# Patient Record
Sex: Female | Born: 1960
Health system: Southern US, Community
[De-identification: ages and names within clinical notes are randomized; demographics above are authoritative.]

## PROBLEM LIST (undated history)

## (undated) DIAGNOSIS — M1990 Unspecified osteoarthritis, unspecified site: Secondary | ICD-10-CM

## (undated) DIAGNOSIS — D649 Anemia, unspecified: Secondary | ICD-10-CM

## (undated) DIAGNOSIS — R7309 Other abnormal glucose: Secondary | ICD-10-CM

## (undated) DIAGNOSIS — R768 Other specified abnormal immunological findings in serum: Secondary | ICD-10-CM

## (undated) DIAGNOSIS — R7989 Other specified abnormal findings of blood chemistry: Secondary | ICD-10-CM

## (undated) DIAGNOSIS — E785 Hyperlipidemia, unspecified: Secondary | ICD-10-CM

## (undated) DIAGNOSIS — D219 Benign neoplasm of connective and other soft tissue, unspecified: Secondary | ICD-10-CM

## (undated) DIAGNOSIS — A64 Unspecified sexually transmitted disease: Secondary | ICD-10-CM

## (undated) DIAGNOSIS — I10 Essential (primary) hypertension: Secondary | ICD-10-CM

## (undated) DIAGNOSIS — Z8249 Family history of ischemic heart disease and other diseases of the circulatory system: Secondary | ICD-10-CM

## (undated) DIAGNOSIS — F419 Anxiety disorder, unspecified: Secondary | ICD-10-CM

## (undated) HISTORY — PX: OVARIAN CYST SURGERY: SHX726

## (undated) HISTORY — DX: Unspecified osteoarthritis, unspecified site: M19.90

## (undated) HISTORY — DX: Hyperlipidemia, unspecified: E78.5

## (undated) HISTORY — DX: Benign neoplasm of connective and other soft tissue, unspecified: D21.9

## (undated) HISTORY — DX: Anemia, unspecified: D64.9

## (undated) HISTORY — DX: Other specified abnormal immunological findings in serum: R76.8

## (undated) HISTORY — DX: Other abnormal glucose: R73.09

## (undated) HISTORY — DX: Unspecified sexually transmitted disease: A64

## (undated) HISTORY — DX: Other specified abnormal findings of blood chemistry: R79.89

## (undated) HISTORY — DX: Family history of ischemic heart disease and other diseases of the circulatory system: Z82.49

## (undated) HISTORY — DX: Anxiety disorder, unspecified: F41.9

## (undated) HISTORY — PX: OTHER SURGICAL HISTORY: SHX169

---

## 2003-12-26 HISTORY — PX: PELVIC LAPAROSCOPY: SHX162

## 2010-09-02 ENCOUNTER — Emergency Department (HOSPITAL_COMMUNITY)
Admission: EM | Admit: 2010-09-02 | Discharge: 2010-09-02 | Payer: Self-pay | Source: Home / Self Care | Admitting: Emergency Medicine

## 2010-12-14 ENCOUNTER — Emergency Department (HOSPITAL_COMMUNITY)
Admission: EM | Admit: 2010-12-14 | Discharge: 2010-12-14 | Disposition: A | Discharge status: Home / Self Care | Payer: 59 | Attending: Emergency Medicine | Admitting: Emergency Medicine

## 2010-12-14 ENCOUNTER — Emergency Department (HOSPITAL_COMMUNITY): Payer: 59

## 2010-12-14 DIAGNOSIS — R079 Chest pain, unspecified: Secondary | ICD-10-CM | POA: Insufficient documentation

## 2010-12-14 DIAGNOSIS — I10 Essential (primary) hypertension: Secondary | ICD-10-CM | POA: Insufficient documentation

## 2010-12-14 DIAGNOSIS — E7800 Pure hypercholesterolemia, unspecified: Secondary | ICD-10-CM | POA: Insufficient documentation

## 2010-12-14 LAB — POCT I-STAT, CHEM 8
Creatinine, Ser: 0.8 mg/dL (ref 0.4–1.2)
HCT: 45 % (ref 36.0–46.0)
Hemoglobin: 15.3 g/dL — ABNORMAL HIGH (ref 12.0–15.0)
Potassium: 4.2 mEq/L (ref 3.5–5.1)
Sodium: 140 mEq/L (ref 135–145)

## 2010-12-14 LAB — POCT CARDIAC MARKERS
CKMB, poc: <1 ng/mL — ABNORMAL LOW (ref 1.0–8.0)
Myoglobin, poc: 42.7 ng/mL (ref 12–200)

## 2012-03-31 ENCOUNTER — Emergency Department (HOSPITAL_COMMUNITY)
Admission: EM | Admit: 2012-03-31 | Discharge: 2012-03-31 | Disposition: A | Discharge status: Home / Self Care | Payer: No Typology Code available for payment source | Attending: Emergency Medicine | Admitting: Emergency Medicine

## 2012-03-31 ENCOUNTER — Encounter (HOSPITAL_COMMUNITY): Payer: Self-pay | Admitting: Emergency Medicine

## 2012-03-31 DIAGNOSIS — I10 Essential (primary) hypertension: Secondary | ICD-10-CM | POA: Insufficient documentation

## 2012-03-31 DIAGNOSIS — S161XXA Strain of muscle, fascia and tendon at neck level, initial encounter: Secondary | ICD-10-CM

## 2012-03-31 DIAGNOSIS — S139XXA Sprain of joints and ligaments of unspecified parts of neck, initial encounter: Secondary | ICD-10-CM | POA: Insufficient documentation

## 2012-03-31 DIAGNOSIS — Y92410 Unspecified street and highway as the place of occurrence of the external cause: Secondary | ICD-10-CM | POA: Insufficient documentation

## 2012-03-31 HISTORY — DX: Essential (primary) hypertension: I10

## 2012-03-31 MED ORDER — HYDROCODONE-ACETAMINOPHEN 5-500 MG PO TABS: 1.0000 | ORAL_TABLET | Freq: Four times a day (QID) | ORAL | Status: AC | PRN

## 2012-03-31 MED ORDER — CYCLOBENZAPRINE HCL 10 MG PO TABS: 10.0000 mg | ORAL_TABLET | Freq: Two times a day (BID) | ORAL | Status: AC | PRN

## 2012-03-31 MED ORDER — IBUPROFEN 600 MG PO TABS: 600.0000 mg | ORAL_TABLET | Freq: Four times a day (QID) | ORAL | Status: AC | PRN
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
## 2013-03-20 ENCOUNTER — Other Ambulatory Visit: Payer: Self-pay

## 2013-03-20 DIAGNOSIS — Z1231 Encounter for screening mammogram for malignant neoplasm of breast: Secondary | ICD-10-CM

## 2013-03-23 ENCOUNTER — Telehealth: Payer: Self-pay | Admitting: Cardiovascular Disease

## 2013-03-23 DIAGNOSIS — Z79899 Other long term (current) drug therapy: Secondary | ICD-10-CM

## 2013-03-23 DIAGNOSIS — E782 Mixed hyperlipidemia: Secondary | ICD-10-CM

## 2013-03-23 NOTE — Telephone Encounter (Signed)
Patient has lost her lab slip---she was given this several month ago.

## 2013-03-23 NOTE — Telephone Encounter (Signed)
Returned call and no answer.  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  Labs ordered and lab slip mailed.

## 2013-03-24 ENCOUNTER — Telehealth: Payer: Self-pay | Admitting: Cardiovascular Disease

## 2013-03-24 NOTE — Telephone Encounter (Signed)
Need lab order-Please fax [phone]

## 2013-03-24 NOTE — Telephone Encounter (Signed)
Returned call.  Pt informed her call was returned yesterday and unable to reach her.  Informed lab slip was mailed yesterday and she needs to present to lab fasting to have labs drawn.  Pt verbalized understanding and agreed w/ plan.  Pt with questions about her bill and transferred to [facility] in billing.

## 2013-03-25 ENCOUNTER — Telehealth: Payer: Self-pay | Admitting: Cardiovascular Disease

## 2013-03-25 MED ORDER — ROSUVASTATIN CALCIUM 10 MG PO TABS: 10.0000 mg | ORAL_TABLET | Freq: Every day | ORAL | Status: DC

## 2013-03-25 NOTE — Telephone Encounter (Signed)
Returned call.  Pt informed she should get labs done so that Dr. Allyson Sabal will know if Crestor has been working for her or not before any changes made.  Pt stated she did receive lab sheet and will go tomorrow to have labs done.  Pt stated her insurance changed and it is too expensive now.  Pt stated she has two pills left and informed she can get a few samples until her lab results are reviewed.  Pt verbalized understanding and agreed w/ plan.

## 2013-03-25 NOTE — Telephone Encounter (Signed)
Samples left at front desk for pt pick up.  Lot: [id number] Exp: 08/2015.

## 2013-03-25 NOTE — Telephone Encounter (Signed)
Wants to know if there is something other than Crestor she can take.  Her insurance has changed and it's very expensive.

## 2013-04-03 ENCOUNTER — Ambulatory Visit: Payer: 59

## 2013-04-10 ENCOUNTER — Encounter: Payer: Self-pay | Admitting: Obstetrics and Gynecology

## 2013-04-10 ENCOUNTER — Telehealth: Payer: Self-pay | Admitting: Obstetrics and Gynecology

## 2013-04-17 ENCOUNTER — Ambulatory Visit: Payer: 59

## 2013-05-06 ENCOUNTER — Encounter: Payer: Self-pay | Admitting: Obstetrics and Gynecology

## 2013-05-25 ENCOUNTER — Ambulatory Visit (INDEPENDENT_AMBULATORY_CARE_PROVIDER_SITE_OTHER): Payer: 59 | Admitting: Obstetrics and Gynecology

## 2013-05-25 ENCOUNTER — Encounter: Payer: Self-pay | Admitting: Obstetrics and Gynecology

## 2013-05-25 VITALS — BP 120/80 | HR 64 | Ht 62.5 in | Wt 186.5 lb

## 2013-05-25 DIAGNOSIS — N912 Amenorrhea, unspecified: Secondary | ICD-10-CM

## 2013-05-25 DIAGNOSIS — Z0000 Encounter for general adult medical examination without abnormal findings: Secondary | ICD-10-CM

## 2013-05-25 DIAGNOSIS — Z01419 Encounter for gynecological examination (general) (routine) without abnormal findings: Secondary | ICD-10-CM

## 2013-05-25 LAB — POCT URINALYSIS DIPSTICK
Glucose, UA: NEGATIVE
Ketones, UA: NEGATIVE

## 2013-05-25 LAB — POCT URINE PREGNANCY: Preg Test, Ur: NEGATIVE

## 2013-05-25 NOTE — Patient Instructions (Signed)
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
## 2013-05-27 LAB — IPS PAP TEST WITH HPV

## 2013-05-29 ENCOUNTER — Ambulatory Visit: Payer: 59

## 2013-06-01 ENCOUNTER — Ambulatory Visit: Payer: 59

## 2013-06-02 ENCOUNTER — Telehealth: Payer: Self-pay | Admitting: Obstetrics and Gynecology

## 2013-06-02 NOTE — Telephone Encounter (Signed)
Pt calling for results

## 2013-06-02 NOTE — Telephone Encounter (Signed)
Notes Recorded by Melony Overly, MD on 05/28/2013 at 8:34 AM Pap and HPV negative.  Please place in pap recall - 02.  Patient notified and verbalized understanding. Will follow up in one year.

## 2013-07-02 ENCOUNTER — Other Ambulatory Visit: Payer: Self-pay

## 2013-09-08 ENCOUNTER — Other Ambulatory Visit: Payer: Self-pay | Admitting: *Deleted

## 2013-09-08 ENCOUNTER — Telehealth: Payer: Self-pay | Admitting: Cardiovascular Disease

## 2013-09-08 DIAGNOSIS — Z79899 Other long term (current) drug therapy: Secondary | ICD-10-CM

## 2013-09-08 DIAGNOSIS — E782 Mixed hyperlipidemia: Secondary | ICD-10-CM

## 2013-09-08 NOTE — Telephone Encounter (Signed)
Is asking for a lab order to be mailed to her. It was sent to her before but she stated she lost it . Please mail another .Marland Kitchen Thanks

## 2013-09-08 NOTE — Telephone Encounter (Signed)
Returned call.  Left message that lab order already sent to lab and slip not needed.  Also to call back before 4pm if questions  Of note, pt lost lab slip she had prior to phone call in July 2014 and slip was mailed then.

## 2013-09-30 ENCOUNTER — Ambulatory Visit (INDEPENDENT_AMBULATORY_CARE_PROVIDER_SITE_OTHER): Payer: 59 | Admitting: Family Medicine

## 2013-09-30 VITALS — BP 110/80 | HR 69 | Temp 98.2°F | Resp 18 | Ht 62.5 in | Wt 187.4 lb

## 2013-09-30 DIAGNOSIS — M1710 Unilateral primary osteoarthritis, unspecified knee: Secondary | ICD-10-CM

## 2013-09-30 DIAGNOSIS — IMO0002 Reserved for concepts with insufficient information to code with codable children: Secondary | ICD-10-CM

## 2013-09-30 DIAGNOSIS — R059 Cough, unspecified: Secondary | ICD-10-CM

## 2013-09-30 DIAGNOSIS — M1711 Unilateral primary osteoarthritis, right knee: Secondary | ICD-10-CM

## 2013-09-30 DIAGNOSIS — R05 Cough: Secondary | ICD-10-CM

## 2013-09-30 MED ORDER — NAPROXEN 500 MG PO TABS: 500.0000 mg | ORAL_TABLET | Freq: Two times a day (BID) | ORAL | Status: DC

## 2013-09-30 MED ORDER — BENZONATATE 100 MG PO CAPS: 100.0000 mg | ORAL_CAPSULE | Freq: Three times a day (TID) | ORAL | Status: DC | PRN

## 2013-09-30 NOTE — Progress Notes (Signed)
   Subjective:    Patient ID: Allison Colon, female    DOB: 1961-03-27, 53 y.o.   MRN: [id number]  HPI Patient presents with bad cough that has been present for one month. Initially started with "the flu" and was sick for 6 days. Continued cough for 2-3 weeks after illness. Cough is slowly improving but still very bothersome. No h/o asthma, COPD, lung disease. Has not tried anything. No history of acid reflux. No shortness of breath, DOE, h/o heart problems. Cough is dry cough. No recent moves, no pets. Does not feel sick other than the cough. No fever, night sweat, weight loss.  Patient also complains of joint pains. Has pain in her right knee, left shoulder, and stiffness in the morning in her hands. Stiffness only lasts for a few minutes. Not currently having joint pain. Not tried anything. Saw ortho doc in [place] who did xrays and told her she had the early stages of arthritis. Tried 800 mg ibuprofen which was helpful but the pain has occasionally returned. Pain is intermittent and occasionally bothers her at sleep. Knee occasionally swells.   Review of Systems  All other systems reviewed and are negative.       Objective:   Physical Exam  Constitutional: She is oriented to person, place, and time. She appears well-developed and well-nourished. No distress.  HENT:  Head: Normocephalic and atraumatic.  Right Ear: External ear normal.  Left Ear: External ear normal.  Mouth/Throat: Oropharynx is clear and moist. No oropharyngeal exudate.  Eyes: Conjunctivae are normal. Pupils are equal, round, and reactive to light. No scleral icterus.  Neck: Normal range of motion. Neck supple.  Cardiovascular: Normal rate, regular rhythm and normal heart sounds.   Pulmonary/Chest: Effort normal and breath sounds normal. No respiratory distress.  Musculoskeletal: Normal range of motion.  Lymphadenopathy:    She has no cervical adenopathy.  Neurological: She is alert and oriented to person, place,  and time.  Skin: Skin is warm and dry. She is not diaphoretic.  Psychiatric: She has a normal mood and affect. Her behavior is normal.      Assessment & Plan:  #1. Cough - suspect post viral - no red flag sx for infection, cancer, heart disease, lung disease - trial tessalon perles - followup 2-3 weeks if not improved, consider CXR at that time and possibly spirometry  #2. Joint pain - Early OA - No active pain - exam reassuring against structural problem - Naproxen prn - F/u prn

## 2013-09-30 NOTE — Patient Instructions (Signed)
Thank you for coming in today  For your knee pain and other joint pain - Likely early arthritis like ortho doctor said - Try naproxen up to 2 x per day as needed for pain - Followup as need - Fish oil may help - Also consider trying glucosamine for joint health  For your cough - I think this will resolve with time - Try cough medicine - Followup if not improved in 2-3 weeks  Cough, Adult  A cough is a reflex that helps clear your throat and airways. It can help heal the body or may be a reaction to an irritated airway. A cough may only last 2 or 3 weeks (acute) or may last more than 8 weeks (chronic).  CAUSES Acute cough:  Viral or bacterial infections. Chronic cough:  Infections.  Allergies.  Asthma.  Post-nasal drip.  Smoking.  Heartburn or acid reflux.  Some medicines.  Chronic lung problems (COPD).  Cancer. SYMPTOMS   Cough.  Fever.  Chest pain.  Increased breathing rate.  High-pitched whistling sound when breathing (wheezing).  Colored mucus that you cough up (sputum). TREATMENT   A bacterial cough may be treated with antibiotic medicine.  A viral cough must run its course and will not respond to antibiotics.  Your caregiver may recommend other treatments if you have a chronic cough. HOME CARE INSTRUCTIONS   Only take over-the-counter or prescription medicines for pain, discomfort, or fever as directed by your caregiver. Use cough suppressants only as directed by your caregiver.  Use a cold steam vaporizer or humidifier in your bedroom or home to help loosen secretions.  Sleep in a semi-upright position if your cough is worse at night.  Rest as needed.  Stop smoking if you smoke. SEEK IMMEDIATE MEDICAL CARE IF:   You have pus in your sputum.  Your cough starts to worsen.  You cannot control your cough with suppressants and are losing sleep.  You begin coughing up blood.  You have difficulty breathing.  You develop pain which is  getting worse or is uncontrolled with medicine.  You have a fever. MAKE SURE YOU:   Understand these instructions.  Will watch your condition.  Will get help right away if you are not doing well or get worse. Document Released: 02/09/2011 Document Revised: 11/05/2011 Document Reviewed: 02/09/2011 [facility] Patient Information 2014 [place].

## 2013-10-02 ENCOUNTER — Ambulatory Visit: Payer: 59

## 2013-10-02 ENCOUNTER — Ambulatory Visit
Admission: RE | Admit: 2013-10-02 | Discharge: 2013-10-02 | Disposition: A | Discharge status: Home / Self Care | Payer: 59 | Source: Ambulatory Visit

## 2013-10-02 DIAGNOSIS — Z1231 Encounter for screening mammogram for malignant neoplasm of breast: Secondary | ICD-10-CM

## 2013-10-27 ENCOUNTER — Telehealth: Payer: Self-pay | Admitting: *Deleted

## 2013-10-27 NOTE — Telephone Encounter (Signed)
Returned call and pt verified x 2.  Pt wanted to know if she could come in to see Dr. Gwenlyn Found.  Denied SOB, sweating, nausea, abdominal or radiation of pain.  Denied taking pain med.  Stated when she presses in the area, she can feel a soreness.  Stated she was wondering if it might be muscle related.  Denied injury.   Informed symptoms do not sound heart-related.  Advised she take Rx Naproxen as directed.  Pt stated she doesn't have it w/ her at work.  Pt stated she has Tylenol and informed she can take that while at work and take naproxen when she gets home.  Pt declined offer to see an Extender and will keep her appt w/ Dr. Gwenlyn Found on next week, 3.11.15.  ER instructions given and pt verbalized understanding.

## 2013-10-27 NOTE — Telephone Encounter (Signed)
Pt was calling in regards to having some CP. She pressed on her left side of her breast under her arm and she is sore. Possible muscular. Not a constant pain  JB

## 2013-11-04 ENCOUNTER — Ambulatory Visit: Payer: 59 | Admitting: Cardiovascular Disease

## 2013-11-10 ENCOUNTER — Telehealth: Payer: Self-pay | Admitting: Obstetrics and Gynecology

## 2013-11-10 NOTE — Telephone Encounter (Signed)
Patient is calling wanting to figure out what is going on she thinks she is going through the change (headaches, hot flashes, missed periods, etc) wants to talk to nurse about it.

## 2013-11-10 NOTE — Telephone Encounter (Signed)
Thank you for discussion complimentary medicine options for the menopausal symptoms.   I am happy to see the patient if she needs a consultation to discuss options further. I will close the encounter.

## 2013-11-10 NOTE — Telephone Encounter (Signed)
Spoke with pt who is having random missed periods, headaches, and hot flashes. Pt had regular periods in October and November, missed one in December, and had periods in Jan and Feb. So far pt has missed cycle this month. Pt having headaches occasionally like she gets before her period starts, relieved by Aleve. Pt had a horrible night sweat back at [facility] that drenched her while she was sleeping. Pt noticing sporadic hot flashes and uses fan at work prn. Pt wants advice about what she can try OTC to alleviate symptoms. Advised that soy containing products like soymilk and edamame might help.  Pt just started drinking soy milk this week. Also mentioned black cohosh as supplement pt could try.  Pt wants to try OTC products before she makes appt to discuss with BS. Pt will call back as needed.

## 2013-11-20 ENCOUNTER — Ambulatory Visit: Payer: 59 | Admitting: Cardiovascular Disease

## 2013-12-10 ENCOUNTER — Encounter: Payer: Self-pay | Admitting: *Deleted

## 2013-12-15 ENCOUNTER — Telehealth: Payer: Self-pay | Admitting: Cardiovascular Disease

## 2013-12-16 NOTE — Telephone Encounter (Signed)
Closed encounter °

## 2013-12-17 ENCOUNTER — Ambulatory Visit: Payer: 59 | Admitting: Cardiovascular Disease

## 2013-12-24 ENCOUNTER — Other Ambulatory Visit: Payer: Self-pay | Admitting: Cardiovascular Disease

## 2013-12-28 ENCOUNTER — Encounter: Payer: Self-pay | Admitting: Cardiovascular Disease

## 2013-12-29 ENCOUNTER — Other Ambulatory Visit: Payer: Self-pay | Admitting: *Deleted

## 2013-12-29 MED ORDER — ROSUVASTATIN CALCIUM 10 MG PO TABS: 10.0000 mg | ORAL_TABLET | Freq: Every day | ORAL | Status: DC

## 2013-12-29 NOTE — Telephone Encounter (Signed)
Rx was sent to pharmacy electronically. 

## 2014-01-08 ENCOUNTER — Ambulatory Visit: Payer: 59 | Admitting: Cardiovascular Disease

## 2014-03-22 ENCOUNTER — Telehealth: Payer: Self-pay | Admitting: Obstetrics and Gynecology

## 2014-03-22 NOTE — Telephone Encounter (Signed)
Patient was last seen for annual with Dr.Silva on 05/25/13. Patient as upcoming appointment for HSV outbreak on 7/31 with Milford Cage, [place]. Patient would like to know of soothing methods for HSV. Dr.Silva other than keeping the area clean and dry and using an oatmeal sitz bath is there anything further this patient can do? Please advise.

## 2014-03-22 NOTE — Telephone Encounter (Signed)
Left message to call Kaitlyn at [phone]. 

## 2014-03-22 NOTE — Telephone Encounter (Signed)
I do not see any history of HSV documented in the patient's chart.  Is there some information we do not have? If patient has not been seen for this previously, she will need evaluation prior to a prescription.

## 2014-03-22 NOTE — Telephone Encounter (Signed)
Patient calling to request advice about "soothing an HSV outbreak." She has an appointment with P. Raquel Sarna 03/26/14 as she is not available to come in sooner.

## 2014-03-23 ENCOUNTER — Other Ambulatory Visit: Payer: Self-pay | Admitting: Obstetrics and Gynecology

## 2014-03-23 MED ORDER — VALACYCLOVIR HCL 500 MG PO TABS: 500.0000 mg | ORAL_TABLET | Freq: Two times a day (BID) | ORAL | Status: DC

## 2014-03-23 NOTE — Telephone Encounter (Signed)
Spoke with patient. Patient states that she was diagnosed with HSV when she was 23. States that she will have an outbreak every 2-3 years. Patient has been really stressed with moving lately and had an outbreak which began five days ago. Patient states that it is itching and uncomfortable. Advised patient to keep area clean and dry, avoid tight clothing if possible, and use oatmeal bath to soothe. Patient is agreeable. Patient would like to discuss medication treatment for the HSV. Patient originally scheduled for Friday 7/31 but patient states that she can be sooner if it is close to lunch. Appointment scheduled for tomorrow at 10:30am with Regina Eck CNM. Patient agreeable to date and time. Would like to be put on on call list if anyone cancels today. Will place her on as both NP's schedules are full currently.  Routing to Dr.Silva Cc: .Regina Eck CNM   Routing to provider for final review. Patient agreeable to disposition. Will close encounter

## 2014-03-23 NOTE — Telephone Encounter (Signed)
Patient left a message after hours returning nurse's call.

## 2014-03-23 NOTE — Telephone Encounter (Signed)
Spoke with patient. Advised of message as seen below from [place]. Patient agreeable and verbalizes understanding.  Routing to provider for final review. Patient agreeable to disposition. Will close encounter.

## 2014-03-23 NOTE — Telephone Encounter (Signed)
Left message to call Buffey Zabinski at [phone]. 

## 2014-03-23 NOTE — Telephone Encounter (Signed)
Have patient keep appointment for tomorrow. I will send an Rx to her pharmacy for her to start some Valtrex.

## 2014-03-24 ENCOUNTER — Encounter: Payer: Self-pay | Admitting: Certified Nurse Midwife

## 2014-03-24 ENCOUNTER — Ambulatory Visit (INDEPENDENT_AMBULATORY_CARE_PROVIDER_SITE_OTHER): Payer: 59 | Admitting: Certified Nurse Midwife

## 2014-03-24 VITALS — BP 110/70 | HR 72 | Resp 16 | Ht 62.5 in | Wt 189.0 lb

## 2014-03-24 DIAGNOSIS — B009 Herpesviral infection, unspecified: Secondary | ICD-10-CM | POA: Insufficient documentation

## 2014-03-24 DIAGNOSIS — B3731 Acute candidiasis of vulva and vagina: Secondary | ICD-10-CM

## 2014-03-24 DIAGNOSIS — B373 Candidiasis of vulva and vagina: Secondary | ICD-10-CM

## 2014-03-24 DIAGNOSIS — A6004 Herpesviral vulvovaginitis: Secondary | ICD-10-CM

## 2014-03-24 MED ORDER — VALACYCLOVIR HCL 500 MG PO TABS: ORAL_TABLET | ORAL | Status: DC

## 2014-03-24 MED ORDER — NYSTATIN-TRIAMCINOLONE 100000-0.1 UNIT/GM-% EX CREA: 1.0000 "application " | TOPICAL_CREAM | Freq: Two times a day (BID) | CUTANEOUS | Status: DC

## 2014-03-24 NOTE — Patient Instructions (Signed)
Herpes Simplex Herpes simplex is generally classified as Type 1 or Type 2. Type 1 is generally the type that is responsible for cold sores. Type 2 is generally associated with sexually transmitted diseases. We now know that most of the thoughts on these viruses are inaccurate. We find that HSV1 is also present genitally and HSV2 can be present orally, but this will vary in different locations of the world. Herpes simplex is usually detected by doing a culture. Blood tests are also available for this virus; however, the accuracy is often not as good.  PREPARATION FOR TEST No preparation or fasting is necessary. NORMAL FINDINGS  No virus present  No HSV antigens or antibodies present Ranges for normal findings may vary among different laboratories and hospitals. You should always check with your doctor after having lab work or other tests done to discuss the meaning of your test results and whether your values are considered within normal limits. MEANING OF TEST  Your caregiver will go over the test results with you and discuss the importance and meaning of your results, as well as treatment options and the need for additional tests if necessary. OBTAINING THE TEST RESULTS  It is your responsibility to obtain your test results. Ask the lab or department performing the test when and how you will get your results. Document Released: 09/15/2004 Document Revised: 11/05/2011 Document Reviewed: 07/24/2008 [employer] Patient Information 2015 [employer]. This information is not intended to replace advice given to you by your health care provider. Make sure you discuss any questions you have with your health care provider.  

## 2014-03-24 NOTE — Progress Notes (Signed)
53 y.o. married african american female [id number] here with complaint of vulva symptoms of itching, burning, and herpes outbreak for the past 4-5 days.. Patient applied Abrevia to area which has helped, but the itching continues. History of HSV 2 and Valtrex use, but did not have Rx.. . Denies new personal products or vaginal dryness. No STD concerns. Urinary symptoms none . Patient has been very stressed with the purchase of a new house and now has closed on and moved in! Spouse also has HSV 2 but no recent outbreak. Denies increase vaginal discharge or vaginal complaints. Declines vaginal exam.  O:Healthy female WDWN Affect: normal, orientation x 3  Exam: Abdomen: non tender Lymph node: no enlargement or tenderness noted Pelvic exam: External genital: Herpetic blister noted in left groin crease. Vulva increase redness and scaling noted. No other areas noted or lesions. Wet prep taken.  Wet Prep results: positive for yeast   A: HSV 2 outbreak, (known) Yeast vulvitis   P:Discussed findings of Herpes blister and yeast vuvitis and etiology.Discussed Valtrex use for suppression to avoid recurrent outbreaks. Discussed Aveeno or baking soda sitz bath for comfort. Avoid moist clothes or pads for extended period of time. If working out in gym clothes or swim suits for long periods of time change underwear or bottoms of swimsuit if possible.  Rx: Valtrex see order with instructions Rx Mycolog cream see order  Rv prn

## 2014-03-25 NOTE — Progress Notes (Signed)
Reviewed personally.  M. Suzanne Ziyah Cordoba, MD.  

## 2014-03-26 ENCOUNTER — Ambulatory Visit: Payer: 59 | Admitting: Nurse Practitioner

## 2014-04-21 ENCOUNTER — Encounter: Payer: Self-pay | Admitting: Obstetrics and Gynecology

## 2014-05-10 ENCOUNTER — Other Ambulatory Visit: Payer: Self-pay | Admitting: Cardiovascular Disease

## 2014-05-10 NOTE — Telephone Encounter (Signed)
Rx was sent to pharmacy electronically. 

## 2014-05-25 ENCOUNTER — Other Ambulatory Visit: Payer: Self-pay | Admitting: Cardiovascular Disease

## 2014-05-25 ENCOUNTER — Telehealth: Payer: Self-pay | Admitting: Cardiovascular Disease

## 2014-05-25 DIAGNOSIS — E7800 Pure hypercholesterolemia, unspecified: Secondary | ICD-10-CM

## 2014-05-25 MED ORDER — ROSUVASTATIN CALCIUM 10 MG PO TABS: ORAL_TABLET | ORAL | Status: DC

## 2014-05-25 MED ORDER — ENALAPRIL MALEATE 10 MG PO TABS: 10.0000 mg | ORAL_TABLET | Freq: Two times a day (BID) | ORAL | Status: DC

## 2014-05-25 NOTE — Telephone Encounter (Signed)
Pt would like some lab orders put in prior to her upcoming appointment with Dr.Berry. Please call   Thanks

## 2014-05-25 NOTE — Telephone Encounter (Signed)
Spoke with pt, aware orders placed for profile and cmet.

## 2014-05-27 ENCOUNTER — Ambulatory Visit: Payer: 59 | Admitting: Obstetrics and Gynecology

## 2014-06-02 ENCOUNTER — Ambulatory Visit: Payer: 59 | Admitting: Obstetrics and Gynecology

## 2014-06-02 ENCOUNTER — Telehealth: Payer: Self-pay | Admitting: Obstetrics and Gynecology

## 2014-06-02 NOTE — Telephone Encounter (Signed)
Thanks Nora °

## 2014-06-02 NOTE — Telephone Encounter (Signed)
Patient canceled her appointment today "cycle started". Patient rescheduled.

## 2014-06-07 NOTE — Telephone Encounter (Signed)
Spoke with patient at time of incoming call. Patient states that she has not had a cycle for 2 months and started her cycle last Thursday. States that her cycle usually lasts 5 days and the last two days are usually light. This will be day five and she is having to change pad/tampon every 4-5 hours. Patient denies any abdominal pain. States she had mild cramping last week. Advised patient that as long as bleeding does not increase this is normal. Advised will need to call to be seen sooner if she has any pain, feels light headed, dizzy, or bleeding increases. Patient is agreeable. Patient has appointment for aex on 10/14 with Dr.Silva. Advised will send a message to Dr.Silva as well and if any further recommendations will return call to let her know. Advised will need to let Dr.Silva know of bleeding pattern when she is in for aex. Patient is agreeable.  Dr.Silva anything further needed for patient?

## 2014-06-07 NOTE — Telephone Encounter (Signed)
Pt has some bleeding concerns and wants to talk to the nurse. Says she has missed some months but it os super heavy now.

## 2014-06-07 NOTE — Telephone Encounter (Signed)
I will see patient on 10/14 for her exam.  Nothing further to do at this time.

## 2014-06-09 ENCOUNTER — Ambulatory Visit: Payer: 59 | Admitting: Obstetrics and Gynecology

## 2014-06-09 ENCOUNTER — Telehealth: Payer: Self-pay | Admitting: Obstetrics and Gynecology

## 2014-06-09 NOTE — Telephone Encounter (Signed)
Thank you for the update!

## 2014-06-09 NOTE — Telephone Encounter (Signed)
Patient calling to cancel her AEX today with Dr. Quincy Simmonds due to "not comfortable coming to be seen on menstrual cycle." No fee charge for missed appointment. Patient rescheduled to 09/22/14 with Dr. Quincy Simmonds.

## 2014-06-28 ENCOUNTER — Encounter: Payer: Self-pay | Admitting: Certified Nurse Midwife

## 2014-07-07 ENCOUNTER — Ambulatory Visit: Payer: 59 | Admitting: Cardiovascular Disease

## 2014-07-19 ENCOUNTER — Telehealth: Payer: Self-pay | Admitting: Obstetrics and Gynecology

## 2014-07-19 NOTE — Telephone Encounter (Signed)
Left message regarding cancelled aex appointment.

## 2014-08-06 ENCOUNTER — Ambulatory Visit: Payer: 59 | Admitting: Obstetrics and Gynecology

## 2014-08-31 ENCOUNTER — Other Ambulatory Visit: Payer: Self-pay | Admitting: Obstetrics and Gynecology

## 2014-08-31 ENCOUNTER — Telehealth: Payer: Self-pay | Admitting: Obstetrics and Gynecology

## 2014-08-31 ENCOUNTER — Other Ambulatory Visit: Payer: Self-pay

## 2014-08-31 DIAGNOSIS — Z1211 Encounter for screening for malignant neoplasm of colon: Secondary | ICD-10-CM

## 2014-08-31 DIAGNOSIS — Z1231 Encounter for screening mammogram for malignant neoplasm of breast: Secondary | ICD-10-CM

## 2014-08-31 NOTE — Telephone Encounter (Signed)
Routing to [employer] as FYI regarding referral.  Will close encounter.

## 2014-08-31 NOTE — Telephone Encounter (Signed)
I will send in a referral to Dr. Collene Mares.

## 2014-08-31 NOTE — Telephone Encounter (Signed)
Pt states she wants a referral for a colonoscopy based on her age. Pt would like something soon and close by. Pt requests call once appointment made. Pt sees Dr Quincy Simmonds  bf

## 2014-08-31 NOTE — Telephone Encounter (Signed)
Pt called to confirm her appointment with dr Quincy Simmonds for 09/22/14. Told patient that appointment was cancelled because Dr Quincy Simmonds will not be in the office. I told her Dr Elza Rafter next available is 07/27/15 she was not happy and asked to speak with Dr Quincy Simmonds because she says that is unfair to her that no one called her and she was just cancelled. Went back to the message that I left for her on 07/19/14 to call and reschedule her appointment. She said that "she didn't get any message being that it was the holidays and we should have just scheduled something for her". Told her I would have Starla give her a call because that was Dr Elza Rafter next available if she wanted to see Dr Quincy Simmonds only. She said "yes, have her and Dr Quincy Simmonds call me".

## 2014-08-31 NOTE — Telephone Encounter (Signed)
Dr.Silva, okay to place referral to Dr.Mann for consult for routine screening colonoscopy?

## 2014-09-02 NOTE — Telephone Encounter (Signed)
Spoke with patient and explained that we do not reschedule appointments anymore that were cancelled by the provider unless we are communicating with the patient directly. Patient voiced understanding and is aware she is on the wait list to be called in the event of an opening with Dr. Quincy Simmonds. The patient did not seems as upset when I spoke with her. I also explained we had another provider leave recently and that is why the schedules are so full. The patient did not request to speak directly with Dr. Quincy Simmonds during our call. FYI only.

## 2014-09-02 NOTE — Telephone Encounter (Signed)
Please work with Gay Filler to try to find a slot sooner for me to see the patient.   Thank you.

## 2014-09-03 NOTE — Telephone Encounter (Signed)
Left a message on voicemail re: opening for AEX Monday, 09/06/14, with Dr. Quincy Simmonds.

## 2014-09-06 NOTE — Telephone Encounter (Signed)
Spoke with patient and she says is on her menstrual cycle right now and not comfortable being seen due to that. She asked me to call her with another opening so we will keep her on the list.

## 2014-09-08 ENCOUNTER — Ambulatory Visit: Payer: 59 | Admitting: Cardiovascular Disease

## 2014-09-21 NOTE — Telephone Encounter (Signed)
Please offer appointment on 10-18-14, surgery has been canceled on this morning and there are appointments on hold.

## 2014-09-22 ENCOUNTER — Encounter (HOSPITAL_COMMUNITY): Payer: Self-pay | Admitting: Emergency Medicine

## 2014-09-22 ENCOUNTER — Ambulatory Visit: Payer: 59 | Admitting: Obstetrics and Gynecology

## 2014-09-22 ENCOUNTER — Emergency Department (HOSPITAL_COMMUNITY)
Admission: EM | Admit: 2014-09-22 | Discharge: 2014-09-22 | Discharge status: Against Medical Advice | Payer: 59 | Attending: Emergency Medicine | Admitting: Emergency Medicine

## 2014-09-22 DIAGNOSIS — R682 Dry mouth, unspecified: Secondary | ICD-10-CM | POA: Insufficient documentation

## 2014-09-22 DIAGNOSIS — R42 Dizziness and giddiness: Secondary | ICD-10-CM | POA: Insufficient documentation

## 2014-09-22 DIAGNOSIS — I10 Essential (primary) hypertension: Secondary | ICD-10-CM | POA: Insufficient documentation

## 2014-09-22 LAB — BASIC METABOLIC PANEL
Anion gap: 12 (ref 5–15)
BUN: 10 mg/dL (ref 6–23)
CALCIUM: 9.7 mg/dL (ref 8.4–10.5)
CO2: 24 mmol/L (ref 19–32)
Chloride: 106 mmol/L (ref 96–112)
Creatinine, Ser: 0.75 mg/dL (ref 0.50–1.10)
GFR calc Af Amer: >90 mL/min (ref >90)
GLUCOSE: 94 mg/dL (ref 70–99)
Potassium: 4.2 mmol/L (ref 3.5–5.1)
Sodium: 142 mmol/L (ref 135–145)

## 2014-09-22 LAB — CBC
HCT: 40.7 % (ref 36.0–46.0)
Hemoglobin: 13.6 g/dL (ref 12.0–15.0)
MCH: 29.1 pg (ref 26.0–34.0)
MCHC: 33.4 g/dL (ref 30.0–36.0)
MCV: 87.2 fL (ref 78.0–100.0)
Platelets: 253 10*3/uL (ref 150–400)
RBC: 4.67 MIL/uL (ref 3.87–5.11)
RDW: 13.2 % (ref 11.5–15.5)
WBC: 9.5 10*3/uL (ref 4.0–10.5)

## 2014-09-22 NOTE — Telephone Encounter (Signed)
Routing to provider for final review. Patient agreeable to disposition. Will close encounter  Routing to Dr Sabra Heck while Dr Quincy Simmonds is out.

## 2014-09-22 NOTE — ED Notes (Signed)
Pt sts she was only going to wait 10 more mins due to the fact she has kids at home.  Gaffer

## 2014-09-22 NOTE — Telephone Encounter (Signed)
Patient scheduled for 10/18/14 at 11:30. Will keep on wait list in case something opens up earlier per patient request. Okay to close encounter?

## 2014-09-22 NOTE — ED Notes (Signed)
Pt apparently left ED room without waiting for EDP.

## 2014-09-22 NOTE — ED Notes (Addendum)
Patient reports dizziness starting after eating 2 slices of pizza and drinking pink lemonade today. Says, "I just didn't feel like I have control." Has experienced anxiety attack before but says this feels different. Denies chest pain/SOB/nausea/diarrhea/emesis/chills. Takes 10 mg Enalipril per day for HTN. Ambulatory. Neurologically intact. Unsure if she could be pregnant.

## 2014-09-23 ENCOUNTER — Ambulatory Visit: Payer: 59 | Admitting: Obstetrics and Gynecology

## 2014-09-27 IMAGING — MG MM SCREENING BREAST TOMO BILATERAL
8 of 12 series · 8 of 28 positions shown · non-contrast
Comparison: Previous exam(s).

CLINICAL DATA: Screening.

EXAM:
DIGITAL SCREENING BILATERAL MAMMOGRAM WITH 3D TOMO WITH CAD
DIGITAL BREAST TOMOSYNTHESIS
Digital breast tomosynthesis images are acquired in two projections.
These images are reviewed in combination with the digital mammogram,
confirming the findings below.

[L CC]
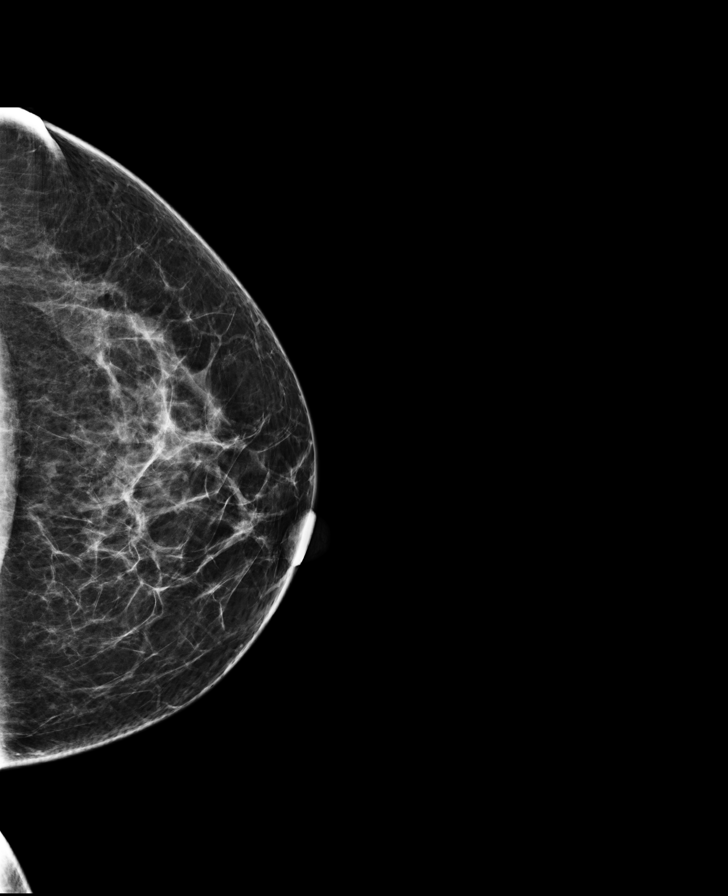

[R CC]
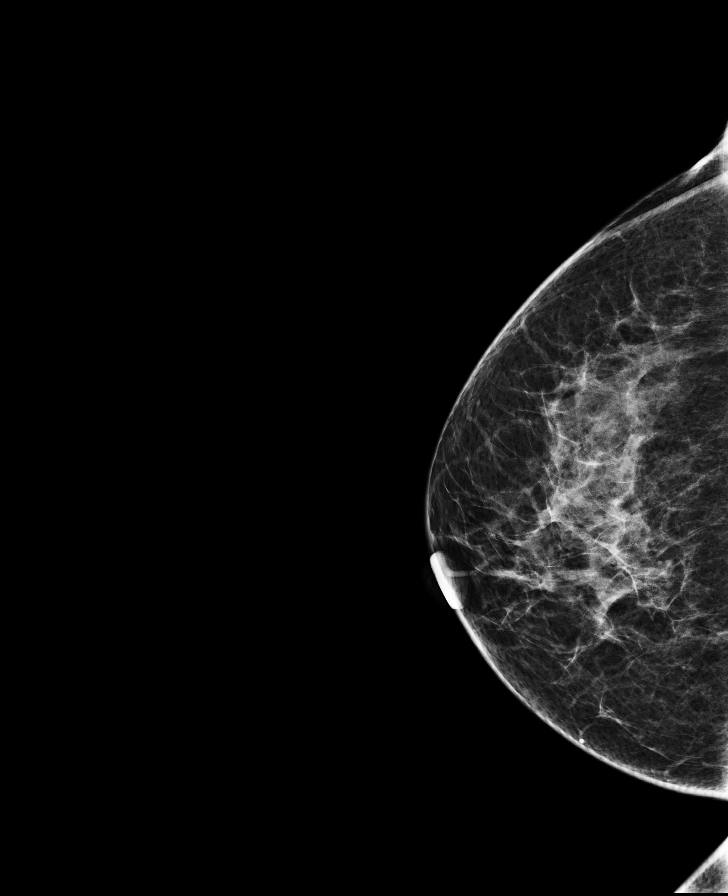

[R MLO]
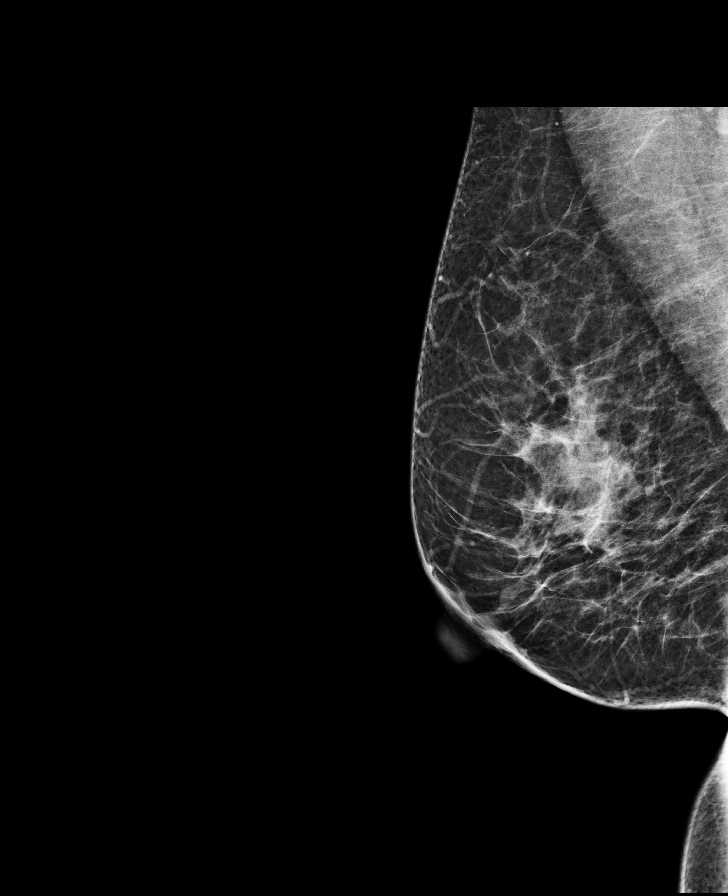

[L MLO]
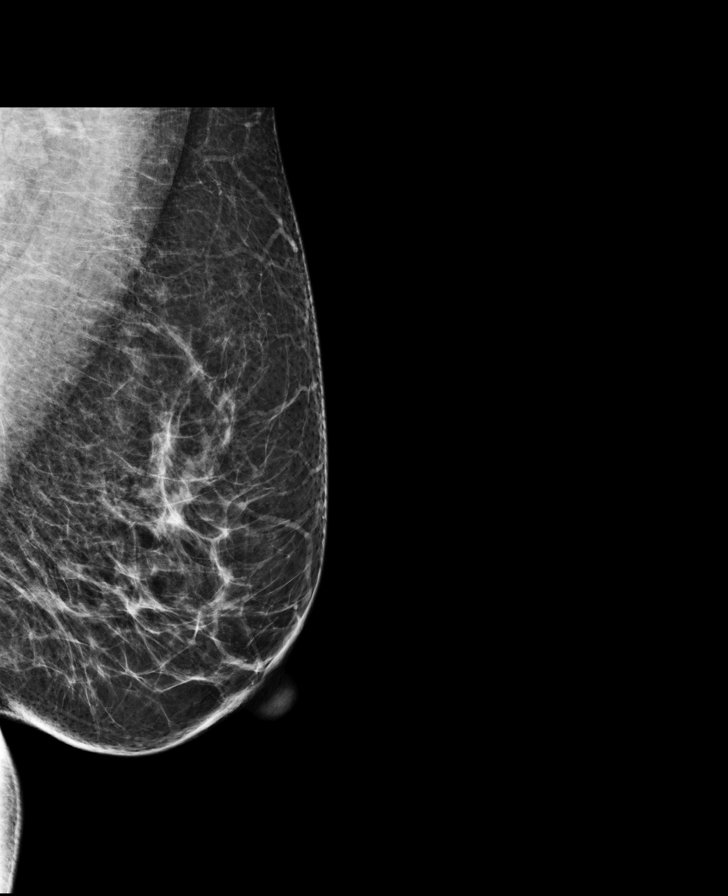

[R CC synth-2D]
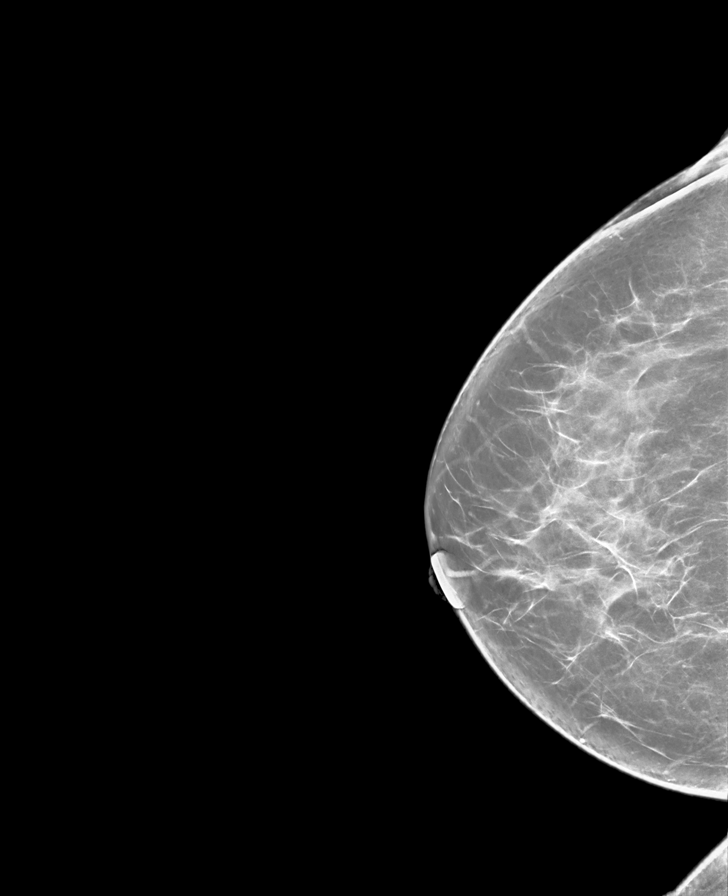

[R MLO synth-2D]
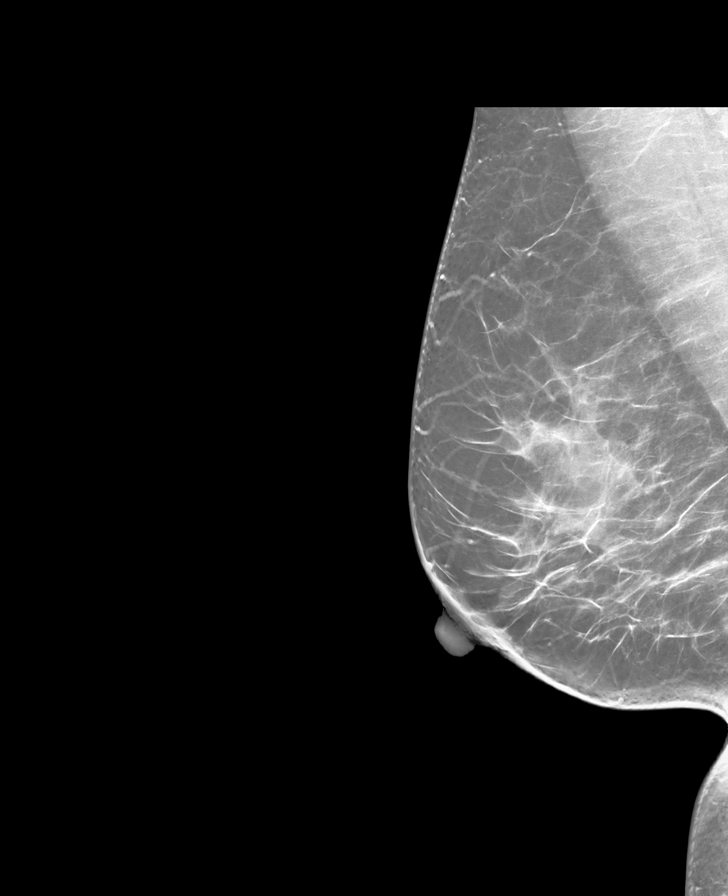

[L MLO synth-2D]
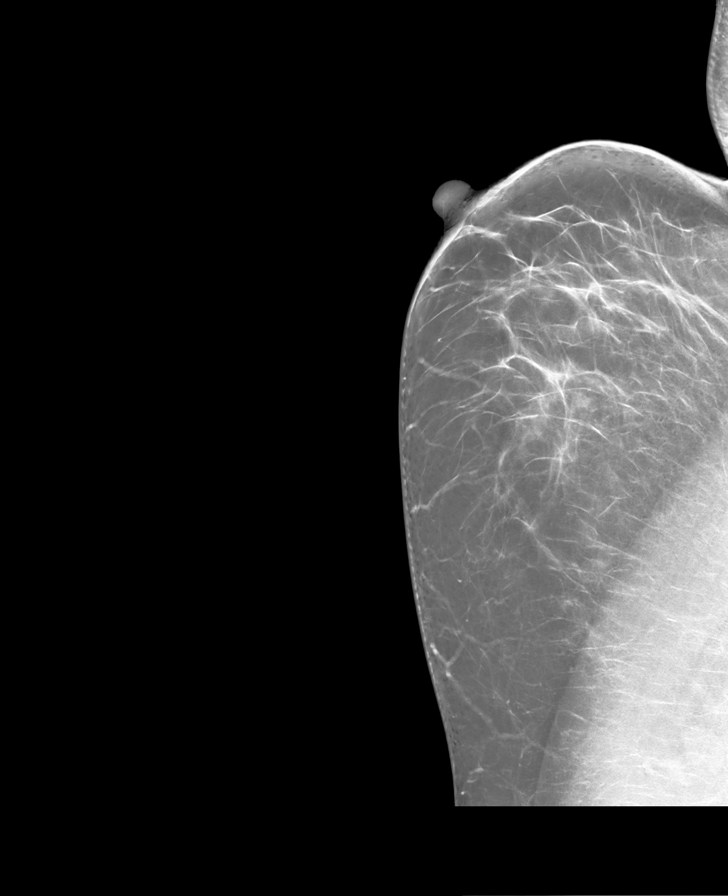

[L CC synth-2D]
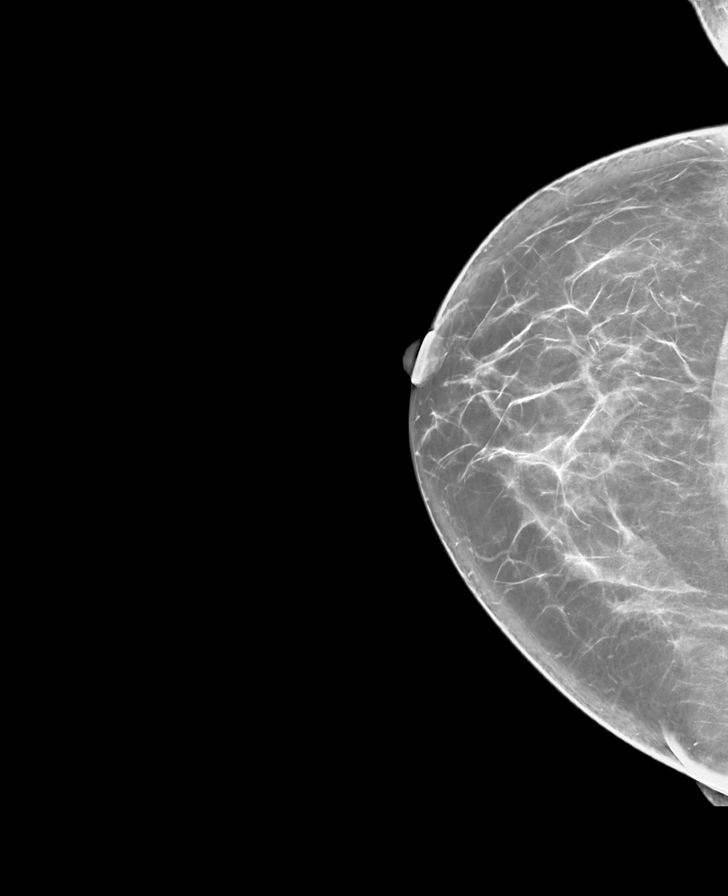

[8 of 28 positions shown; findings below may reference images not displayed]

ACR Breast Density Category b: There are scattered areas of
fibroglandular density.
FINDINGS: There are no findings suspicious for malignancy. Images were
processed with CAD.
IMPRESSION: No mammographic evidence of malignancy. A result letter of this
screening mammogram will be mailed directly to the patient.

RECOMMENDATION:
Screening mammogram in one year. (Code:LC-5-VFV)

BI-RADS CATEGORY  1: Negative.

## 2014-09-30 ENCOUNTER — Ambulatory Visit: Payer: 59 | Admitting: Obstetrics and Gynecology

## 2014-10-04 ENCOUNTER — Ambulatory Visit: Payer: Self-pay

## 2014-10-11 ENCOUNTER — Ambulatory Visit: Payer: 59

## 2014-10-12 ENCOUNTER — Encounter: Payer: Self-pay | Admitting: Cardiovascular Disease

## 2014-10-12 ENCOUNTER — Ambulatory Visit (INDEPENDENT_AMBULATORY_CARE_PROVIDER_SITE_OTHER): Payer: 59 | Admitting: Cardiovascular Disease

## 2014-10-12 VITALS — BP 126/86 | HR 86 | Ht 61.0 in | Wt 191.3 lb

## 2014-10-12 DIAGNOSIS — I10 Essential (primary) hypertension: Secondary | ICD-10-CM

## 2014-10-12 DIAGNOSIS — Z79899 Other long term (current) drug therapy: Secondary | ICD-10-CM

## 2014-10-12 DIAGNOSIS — Z7689 Persons encountering health services in other specified circumstances: Secondary | ICD-10-CM

## 2014-10-12 DIAGNOSIS — Z0289 Encounter for other administrative examinations: Secondary | ICD-10-CM

## 2014-10-12 DIAGNOSIS — E785 Hyperlipidemia, unspecified: Secondary | ICD-10-CM

## 2014-10-12 NOTE — Assessment & Plan Note (Signed)
History of hypertension on enalapril 10 mg a day with blood pressure measured today at 126/86. Continue current meds at current dose

## 2014-10-12 NOTE — Patient Instructions (Addendum)
Your physician wants you to follow-up in 1 year with Dr. Gwenlyn Found. You will receive a reminder letter in the mail 2 months in advance. If you do not receive a letter, please call our office to schedule the follow-up appointment.  Dr. Gwenlyn Found has ordered for you to have lab work done.  You have been referred to [facility]
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
## 2014-10-12 NOTE — Assessment & Plan Note (Signed)
History of hyperlipidemia on atorvastatin 10 mg a day. We will recheck a lipid and liver profile

## 2014-10-13 LAB — HEPATIC FUNCTION PANEL
ALT: 15 U/L (ref 0–35)
AST: 20 U/L (ref 0–37)
Albumin: 4.1 g/dL (ref 3.5–5.2)
Alkaline Phosphatase: 45 U/L (ref 39–117)
Bilirubin, Direct: 0.1 mg/dL (ref 0.0–0.3)
Indirect Bilirubin: 0.4 mg/dL (ref 0.2–1.2)
Total Bilirubin: 0.5 mg/dL (ref 0.2–1.2)
Total Protein: 6.8 g/dL (ref 6.0–8.3)

## 2014-10-13 LAB — LIPID PANEL
CHOL/HDL RATIO: 3.1 ratio
Cholesterol: 154 mg/dL (ref 0–200)
HDL: 49 mg/dL (ref >39)
LDL CALC: 89 mg/dL (ref 0–99)
TRIGLYCERIDES: 81 mg/dL (ref <150)
VLDL: 16 mg/dL (ref 0–40)

## 2014-10-14 ENCOUNTER — Ambulatory Visit: Payer: 59

## 2014-10-14 ENCOUNTER — Other Ambulatory Visit: Payer: Self-pay | Admitting: Orthopaedic Surgery

## 2014-10-14 ENCOUNTER — Other Ambulatory Visit: Payer: Self-pay

## 2014-10-14 ENCOUNTER — Ambulatory Visit
Admission: RE | Admit: 2014-10-14 | Discharge: 2014-10-14 | Disposition: A | Discharge status: Home / Self Care | Payer: 59 | Source: Ambulatory Visit

## 2014-10-14 DIAGNOSIS — M25511 Pain in right shoulder: Secondary | ICD-10-CM

## 2014-10-14 DIAGNOSIS — Z1231 Encounter for screening mammogram for malignant neoplasm of breast: Secondary | ICD-10-CM

## 2014-10-18 ENCOUNTER — Telehealth: Payer: Self-pay | Admitting: Obstetrics and Gynecology

## 2014-10-18 ENCOUNTER — Ambulatory Visit: Payer: Self-pay | Admitting: Obstetrics and Gynecology

## 2014-10-18 NOTE — Telephone Encounter (Signed)
Please find a slot for the patient to come in sooner.  As long as it is a 30 minute slot, that is great!

## 2014-10-18 NOTE — Telephone Encounter (Signed)
Pt scheduled on Monday 2/29 @ 10am. Pt agreeable.

## 2014-10-18 NOTE — Telephone Encounter (Signed)
Thank you for scheduling on February for me to see the patient! Encounter closed.

## 2014-10-18 NOTE — Telephone Encounter (Signed)
Pt called over weekend and left voicemail to cancel her aex 10/18/14. Pt states started cycle and is uncomfortable having aex on her cycle. Dr Elza Rafter next aex in 08/2015. Pt preferred to be added to wait list without scheduling at this time. Pt added to wait list.

## 2014-10-24 ENCOUNTER — Other Ambulatory Visit: Payer: 59

## 2014-10-25 ENCOUNTER — Encounter: Payer: Self-pay | Admitting: Obstetrics and Gynecology

## 2014-10-25 ENCOUNTER — Ambulatory Visit (INDEPENDENT_AMBULATORY_CARE_PROVIDER_SITE_OTHER): Payer: 59 | Admitting: Obstetrics and Gynecology

## 2014-10-25 VITALS — BP 116/80 | HR 90 | Ht 62.5 in | Wt 190.4 lb

## 2014-10-25 DIAGNOSIS — Z01419 Encounter for gynecological examination (general) (routine) without abnormal findings: Secondary | ICD-10-CM

## 2014-10-25 DIAGNOSIS — A6004 Herpesviral vulvovaginitis: Secondary | ICD-10-CM

## 2014-10-25 MED ORDER — VALACYCLOVIR HCL 500 MG PO TABS
ORAL_TABLET | ORAL | Status: DC
Start: 2014-10-25 — End: 2015-11-09

## 2014-10-25 NOTE — Patient Instructions (Signed)
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
## 2014-10-30 ENCOUNTER — Ambulatory Visit
Admission: RE | Admit: 2014-10-30 | Discharge: 2014-10-30 | Disposition: A | Discharge status: Home / Self Care | Payer: 59 | Source: Ambulatory Visit | Attending: Orthopaedic Surgery | Admitting: Orthopaedic Surgery

## 2014-10-30 DIAGNOSIS — M25511 Pain in right shoulder: Secondary | ICD-10-CM

## 2014-11-13 ENCOUNTER — Ambulatory Visit
Admission: RE | Admit: 2014-11-13 | Discharge: 2014-11-13 | Disposition: A | Discharge status: Home / Self Care | Payer: 59 | Source: Ambulatory Visit | Attending: Orthopaedic Surgery | Admitting: Orthopaedic Surgery

## 2015-01-04 ENCOUNTER — Telehealth: Payer: Self-pay | Admitting: Obstetrics and Gynecology

## 2015-01-04 NOTE — Telephone Encounter (Signed)
Pt states she would like to speak with Dr Elza Rafter nurse regarding menopausal symptoms - headaches, emotional, period changes, mood swings, insomnia. " I just feel down, like i want to cry for no reason. I can't take this"

## 2015-01-04 NOTE — Telephone Encounter (Signed)
Spoke with patient. She states that she really feels like she is having menopausal changes. LMP 12/24/14 and lasted for ten days, not heavy or concerning for patient, however continued to have spotting for up to ten days. States "I am sitting at my desk and I just feel so down. The biggest thing is I feel these mood swings and the tiniest little thing gets me so angry and upset." Patient states she is not sleeping well. Also having intermittent headaches. States she usually treats headaches with OTC BC Powder and last night this did not help her headache, and it usually does. She states all of these symptoms have been going on "for awhile now." Patient denies any thoughts of harm to self or anyone else.  Patient with hx of high blood pressure, states she took her BP at home today and it was 121/79. Patient states she has seen PCP regarding headaches and states she is referred back to to GYN because they feel are related to hormones.  Patient declines office visit today with NP.  Patient requests specifically that hormone levels be drawn. Advised would need office visit to discuss with provider to see if blood testing is appropriate. Patient declines. She requests that I send a message to Dr. Quincy Simmonds and return call with her response.Patient is advised that Dr. Quincy Simmonds is out of the office today, in surgery, so I do not know when she will be able to review message. Patient aware and states she feels okay to wait for response from Dr. Quincy Simmonds.

## 2015-01-05 NOTE — Telephone Encounter (Signed)
1404: Outgoing call to patient. She is given message from Dr. Quincy Simmonds and is agreeable. She wrote down names of suggestions from Dr. Quincy Simmonds and states she will try those alternatives. Patient agreeable to appointment with Dr. Quincy Simmonds, however, requests only appointment during "lunch hours." Multiple date/time options discussed and offered, patient chooses to be scheduled for 01/12/15 at 1300 with Dr. Quincy Simmonds. Advised patient to return call at any time to office if any worsening of symptoms or concern, patient verbalizes understanding of instructions.   Routing to provider for final review. Patient agreeable to disposition. Patient aware provider will review message and nurse will return call with any additional instructions or change of disposition. Will close encounter.

## 2015-01-05 NOTE — Telephone Encounter (Signed)
I really would like to help the patient and recommend an office visit for proper evaluation and treatment!  There are a lot of options on how to treat her symptoms.  If she would like to do an herbal therapy such as Estroven for menopausal symptoms and St. John's Wort for depression symptoms, she may start these now.  Thank you.

## 2015-01-06 ENCOUNTER — Other Ambulatory Visit: Payer: Self-pay | Admitting: Orthopedic Surgery

## 2015-01-12 ENCOUNTER — Telehealth: Payer: Self-pay | Admitting: Obstetrics and Gynecology

## 2015-01-12 ENCOUNTER — Ambulatory Visit: Payer: 59 | Admitting: Obstetrics and Gynecology

## 2015-01-12 NOTE — Telephone Encounter (Signed)
Thank you for rescheduling this appointment.   Note in chart from 01/05/15 confirms patient agreed by phone for her appointment for today.  Cc-Greta Jimmye Norman

## 2015-01-12 NOTE — Telephone Encounter (Signed)
Spoke with patient regarding her missed appointment from today and she says she didn't know she had an appointment.  Was still waiting on Dr Quincy Simmonds to call her.

## 2015-01-12 NOTE — Telephone Encounter (Signed)
Call to patient. Patient states she is unable to talk at this time and can only briefly discuss appointment. Advised patient that she was scheduled for office visit today at 1:00 with Dr. Quincy Simmonds when we spoke on the 11 th and she was given instructions on starting OTC treatment prior to appointment, we discussed multiple dates and time options but were able to find an appointment that worked to accommodate her time window. Patient states "this must be a miscommunication on your end because I didn't make an appointment."  Patient would like to reschedule and she is given appointment for this Friday 01/14/15 at 1:00. Confirmed appointment date and time with patient and patient agrees and disconnected line.   Routing to provider for final review.  Will close encounter. Patient rescheduled as disposition.

## 2015-01-14 ENCOUNTER — Ambulatory Visit: Payer: 59 | Admitting: Obstetrics and Gynecology

## 2015-01-14 ENCOUNTER — Telehealth: Payer: Self-pay | Admitting: Obstetrics and Gynecology

## 2015-01-14 NOTE — Telephone Encounter (Signed)
Patient dnka appointment "Menopausal symptoms/depression symptoms" 01/14/15 with Dr.Silva @ 1:00pm. Left message for patient to call and reschedule.

## 2015-01-20 NOTE — Telephone Encounter (Signed)
No further follow up is needed. Patient responsibility to follow up.  Encounter closed.

## 2015-01-28 ENCOUNTER — Ambulatory Visit (INDEPENDENT_AMBULATORY_CARE_PROVIDER_SITE_OTHER): Payer: 59 | Admitting: Obstetrics and Gynecology

## 2015-01-28 ENCOUNTER — Encounter: Payer: Self-pay | Admitting: Obstetrics and Gynecology

## 2015-01-28 VITALS — BP 122/82 | HR 80 | Ht 62.5 in | Wt 189.2 lb

## 2015-01-28 DIAGNOSIS — N951 Menopausal and female climacteric states: Secondary | ICD-10-CM | POA: Diagnosis not present

## 2015-01-28 LAB — TSH: TSH: 1.121 u[IU]/mL (ref 0.350–4.500)

## 2015-01-28 NOTE — Patient Instructions (Signed)
Menopause Menopause is the normal time of life when menstrual periods stop completely. Menopause is complete when you have missed 12 consecutive menstrual periods. It usually occurs between the ages of 48 years and 55 years. Very rarely does a woman develop menopause before the age of 40 years. At menopause, your ovaries stop producing the female hormones estrogen and progesterone. This can cause undesirable symptoms and also affect your health. Sometimes the symptoms may occur 4-5 years before the menopause begins. There is no relationship between menopause and:  Oral contraceptives.  Number of children you had.  Race.  The age your menstrual periods started (menarche). Heavy smokers and very thin women may develop menopause earlier in life. CAUSES  The ovaries stop producing the female hormones estrogen and progesterone.  Other causes include:  Surgery to remove both ovaries.  The ovaries stop functioning for no known reason.  Tumors of the pituitary gland in the brain.  Medical disease that affects the ovaries and hormone production.  Radiation treatment to the abdomen or pelvis.  Chemotherapy that affects the ovaries. SYMPTOMS   Hot flashes.  Night sweats.  Decrease in sex drive.  Vaginal dryness and thinning of the vagina causing painful intercourse.  Dryness of the skin and developing wrinkles.  Headaches.  Tiredness.  Irritability.  Memory problems.  Weight gain.  Bladder infections.  Hair growth of the face and chest.  Infertility. More serious symptoms include:  Loss of bone (osteoporosis) causing breaks (fractures).  Depression.  Hardening and narrowing of the arteries (atherosclerosis) causing heart attacks and strokes. DIAGNOSIS   When the menstrual periods have stopped for 12 straight months.  Physical exam.  Hormone studies of the blood. TREATMENT  There are many treatment choices and nearly as many questions about them. The  decisions to treat or not to treat menopausal changes is an individual choice made with your health care provider. Your health care provider can discuss the treatments with you. Together, you can decide which treatment will work best for you. Your treatment choices may include:   Hormone therapy (estrogen and progesterone).  Non-hormonal medicines.  Treating the individual symptoms with medicine (for example antidepressants for depression).  Herbal medicines that may help specific symptoms.  Counseling by a psychiatrist or psychologist.  Group therapy.  Lifestyle changes including:  Eating healthy.  Regular exercise.  Limiting caffeine and alcohol.  Stress management and meditation.  No treatment. HOME CARE INSTRUCTIONS   Take the medicine your health care provider gives you as directed.  Get plenty of sleep and rest.  Exercise regularly.  Eat a diet that contains calcium (good for the bones) and soy products (acts like estrogen hormone).  Avoid alcoholic beverages.  Do not smoke.  If you have hot flashes, dress in layers.  Take supplements, calcium, and vitamin D to strengthen bones.  You can use over-the-counter lubricants or moisturizers for vaginal dryness.  Group therapy is sometimes very helpful.  Acupuncture may be helpful in some cases. SEEK MEDICAL CARE IF:   You are not sure you are in menopause.  You are having menopausal symptoms and need advice and treatment.  You are still having menstrual periods after age 55 years.  You have pain with intercourse.  Menopause is complete (no menstrual period for 12 months) and you develop vaginal bleeding.  You need a referral to a specialist (gynecologist, psychiatrist, or psychologist) for treatment. SEEK IMMEDIATE MEDICAL CARE IF:   You have severe depression.  You have excessive vaginal bleeding.    You fell and think you have a broken bone.  You have pain when you urinate.  You develop leg or  chest pain.  You have a fast pounding heart beat (palpitations).  You have severe headaches.  You develop vision problems.  You feel a lump in your breast.  You have abdominal pain or severe indigestion. Document Released: 11/03/2003 Document Revised: 04/15/2013 Document Reviewed: 03/12/2013 ExitCare Patient Information 2015 [employer]. This information is not intended to replace advice given to you by your health care provider. Make sure you discuss any questions you have with your health care provider.  

## 2015-01-28 NOTE — Progress Notes (Signed)
Patient ID: Allison Colon, female   DOB: 1960/10/04, 54 y.o.   MRN: [id number] GYNECOLOGY  VISIT   HPI: 54 y.o.   Married  [place] American  female   [phone] with Patient's last menstrual period was 12/25/2014 (exact date).   here for consultation regarding insomnia and occasional night sweats.     Wants hormones tested.  Having joint pain.  Has seen PCP. Headaches.  Cannot sleep.  Flashing wakes the patient up.  These do not happen all the time.  Sweat running down her chest.  Some anxiety attacks also while driving. Chest pains for no reason at all.  Has seen cardiology Feb. 2016 for family history of cardiovascular disease.  Menses January and December 25, 2014.  Menses Thanksgiving and Christmas 2015.  Menses 4 - 5 times a year.   Increased stress and emotional response at home with children. Denies depression.  GYNECOLOGIC HISTORY: Patient's last menstrual period was 12/25/2014 (exact date). Contraception:None Menopausal hormone therapy: n/a Last mammogram: 10-18-14 Dense Cat B/nl:[facility]. Last pap smear: 05-25-13 wnl:neg HR HPV        OB History    Gravida Para Term Preterm AB TAB SAB Ectopic Multiple Living   10 1 1  9 8 1   1          Patient Active Problem List   Diagnosis Date Noted  . Essential hypertension 10/12/2014  . Hyperlipidemia 10/12/2014  . HSV-2 (herpes simplex virus 2) infection 03/24/2014    Class: History of    Past Medical History  Diagnosis Date  . Hypertension   . Anemia   . Anxiety   . Fibroid   . Hyperlipidemia   . STD (sexually transmitted disease)     HSV  . Family history of heart disease     Past Surgical History  Procedure Laterality Date  . Pelvic laparoscopy  03/2004    RSO  . Broken leg Bilateral     age 46    Current Outpatient Prescriptions  Medication Sig Dispense Refill  . aspirin EC 325 MG tablet Take 325 mg by mouth at bedtime.    . CRESTOR 10 MG tablet Take 1 tablet by mouth daily.    .  enalapril (VASOTEC) 10 MG tablet Take 1 tablet (10 mg total) by mouth 2 (two) times daily. 60 tablet 6  . valACYclovir (VALTREX) 500 MG tablet One tablet daily, increase to one bid at outbreak for 3 days 30 tablet 12  . traMADol (ULTRAM) 50 MG tablet      No current facility-administered medications for this visit.     ALLERGIES: Review of patient's allergies indicates no known allergies.  Family History  Problem Relation Age of Onset  . Diabetes Mother   . Hypertension Mother   . Hyperlipidemia Mother   . Heart disease Mother   . HIV Father   . Hypertension Father   . Heart disease Sister   . HIV Sister   . Hepatitis Brother   . Gout Brother     History   Social History  . Marital Status: Married    Spouse Name: N/A  . Number of Children: N/A  . Years of Education: N/A   Occupational History  . Not on file.   Social History Main Topics  . Smoking status: Never Smoker   . Smokeless tobacco: Not on file  . Alcohol Use: No  . Drug Use: No  . Sexual Activity:    Partners: Male  Birth Control/ Protection: None   Other Topics Concern  . Not on file   Social History Narrative    ROS:  Pertinent items are noted in HPI.  PHYSICAL EXAMINATION:    BP 122/82 mmHg  Pulse 80  Ht 5' 2.5" (1.588 m)  Wt 189 lb 3.2 oz (85.821 kg)  BMI 34.03 kg/m2  LMP 12/25/2014 (Exact Date)    General appearance: alert, cooperative and appears stated age  ASSESSMENT  Menopausal symptoms. Panic.  Joint pains.   PLAN  Counseled regarding menopause and treatment options - prescription hormonal therapy, prescription nonhormonal therapy - SSRIs and SNRIs, and herbal treatments. Discussed risks of HRT - MI, stroke, PE, DVT, breast cancer.  Discussed risks of SSRI/SNRI - decreased libido, weight gain, and serotonin syndrome. Will check FSH, estradiol, TSH.  Declines Rx at this time.  Comprehensive written material about menopause also provided.  If panic and joint pains persist,  will need follow up with PCP, psychology as appropriate.   An After Visit Summary was printed and given to the patient.  ___15___ minutes face to face time of which over 50% was spent in counseling.

## 2015-01-29 LAB — FOLLICLE STIMULATING HORMONE: FSH: 52.9 m[IU]/mL

## 2015-01-29 LAB — ESTRADIOL: Estradiol: 15.5 pg/mL

## 2015-02-01 ENCOUNTER — Other Ambulatory Visit: Payer: Self-pay | Admitting: Cardiovascular Disease

## 2015-02-01 NOTE — Telephone Encounter (Signed)
Rx has been sent to the pharmacy electronically. ° °

## 2015-02-14 ENCOUNTER — Encounter (HOSPITAL_BASED_OUTPATIENT_CLINIC_OR_DEPARTMENT_OTHER): Payer: Self-pay

## 2015-02-14 ENCOUNTER — Ambulatory Visit (HOSPITAL_BASED_OUTPATIENT_CLINIC_OR_DEPARTMENT_OTHER): Admit: 2015-02-14 | Payer: 59 | Admitting: Orthopedic Surgery

## 2015-02-14 SURGERY — SHOULDER ARTHROSCOPY WITH ROTATOR CUFF REPAIR AND SUBACROMIAL DECOMPRESSION
Anesthesia: Choice | Laterality: Right

## 2015-02-16 ENCOUNTER — Telehealth: Payer: Self-pay | Admitting: Obstetrics and Gynecology

## 2015-02-16 NOTE — Telephone Encounter (Signed)
Spoke with patient. Patient states that she has a coworker who was diagnosed with cancer of her fallopian tube. Patient has concerns about how to prevent this for late diagnosis. Advised patient of importance of yearly aex with pelvic exam and pap if indicated. Advised if she were to experience any new concerning symptoms would want to see her earlier than every 12 months. Patient denies any current symptoms "I am just worried." Advised again of importance of yearly exams for evaluation. Patient is agreeable. Patient also asking for recommendation on herbal supplements for menopausal symptoms and vitamins that are good for reproductive health. Advised may try [place], Nesco, or [employer] for menopausal symptoms. Advised will speak with Dr.Silva regarding vitamin recommendations and return call. Patient is agreeable.

## 2015-02-16 NOTE — Telephone Encounter (Signed)
Spoke with patient. Advised of message as seen below from Dr.Silva. Patient is agreeable and verbalizes understanding.  Routing to provider for final review. Patient agreeable to disposition. Will close encounter.  

## 2015-02-16 NOTE — Telephone Encounter (Signed)
I am sorry to learn of patient's coworker with tubal cancer.  This is a very rare cancer and behaves similarly to ovarian cancer.  In fact, many ovarian cancer probably start as tubal cancers.  The risk of ovarian cancer in the general population is roughly 1 in 90 patients. This risk is increased if there is a family history of ovarian cancer.  There are frequently signs of ovarian cancer such as abdominal bloating, pain, change in bowel function, increasing abdominal girth yet weight loss occurring.  There are no current guidelines for routine ovarian or tubal cancer screening.  Keep yearly appointments.   I agree with the herbal remedies that you suggested.  A general multivitamin may be indicated if patient's diet is irregular.  Just be aware of the following: Vitamins with iron and calcium can constipate. Calcium can come best from dietary sources - mild products, almonds, Total cereal.

## 2015-02-16 NOTE — Telephone Encounter (Signed)
Patient calling with questions about how often she should get a Pap smear.

## 2015-02-21 ENCOUNTER — Telehealth: Payer: Self-pay | Admitting: Cardiovascular Disease

## 2015-02-21 MED ORDER — ROSUVASTATIN CALCIUM 10 MG PO TABS: 10.0000 mg | ORAL_TABLET | Freq: Every day | ORAL | Status: DC

## 2015-02-21 NOTE — Telephone Encounter (Signed)
°  1. Which medications need to be refilled? Crestor( needs a  new prescription sent )   2. Which pharmacy is medication to be sent to?[facility] on ([address])   3. Do they need a 30 day or 90 day supply? 90  4. Would they like a call back once the medication has been sent to the pharmacy? yes

## 2015-02-21 NOTE — Telephone Encounter (Signed)
E-sent to pharmacy. Patient aware

## 2015-03-24 ENCOUNTER — Telehealth: Payer: Self-pay | Admitting: Cardiovascular Disease

## 2015-03-24 ENCOUNTER — Telehealth: Payer: Self-pay | Admitting: Obstetrics and Gynecology

## 2015-03-24 NOTE — Telephone Encounter (Signed)
Left message to call Kaitlyn at [phone]. 

## 2015-03-24 NOTE — Telephone Encounter (Signed)
Spoke with patient. Advised patient that swelling of ankles is not a symptom of menopause. Denies any other symptoms such as shortness of breath. Advised if she is experiencing ankle swelling will need to be seen with urgent care or PCP. Patient is agreeable and will call to schedule appointment with PCP.  Routing to provider for final review. Patient agreeable to disposition. Will close encounter.   Patient aware provider will review message and nurse will return call if any additional advice or change of disposition.

## 2015-03-24 NOTE — Telephone Encounter (Signed)
Pt stated some concerns regarding kidney function and perimenopause - was under impression lipid panel we ordered in past was a check for kidney function - explained not a direct relationship between cholesterol markers and kidney function. Pt seemed to voice understanding.  She stated some other concerns esp r/t perimenopause, body changes - advised would be best answered by OBGYN or GP. She voiced understanding.

## 2015-03-24 NOTE — Telephone Encounter (Signed)
Routed to Brita Romp, Therapist, sports.

## 2015-03-24 NOTE — Telephone Encounter (Signed)
I spoke with patient.  She complains of bilateral lower extremity ankle edema that has been occuring x2-3 days.  The right >left, no discoloration or temperature difference.  The ankles go down at night and we she elevates her legs.  No chest pain or SOB.  She admits to eating some Ramen noodles on Tuesday.  She also is on her feet during the day and wears heals.  Today, she says the swelling is minor and a bystander wouldn't notice it.  I encouraged patient to stay hydrated, elevate legs in pm, and avoid salt.  She will contact our office if symptoms worsen or do not improve.

## 2015-03-24 NOTE — Telephone Encounter (Signed)
Patient says her ankles are swollen and wondering if this could be a menopausal symptom.

## 2015-03-24 NOTE — Telephone Encounter (Signed)
Patient called earlier today and was not happy with Nathan's response to her question.  Would like to speak with Dr. Kennon Holter nurse.

## 2015-03-24 NOTE — Telephone Encounter (Signed)
Allison Colon is wanting to know if she can have lab work done to check her kidneys . Please call  Thanks

## 2015-05-30 ENCOUNTER — Telehealth: Payer: Self-pay | Admitting: Obstetrics and Gynecology

## 2015-05-30 NOTE — Telephone Encounter (Signed)
Patient has a question regarding menopause.

## 2015-05-31 NOTE — Telephone Encounter (Addendum)
Spoke with patient. Patient states that she has not had a cycle since April. Patient started her cycle this month and it is heavier than normal. "It is just a little heavier than what it used to be. It started off dark and now it is red." Advised patient this is normal as she has not had a cycle monthly. Patient is in perimenopause. Advised this is not uncommon. [place] was 52.9 and Estradiol was 15.5 on 01/28/2015. Advised patient if she has increased bleeding to having to change her pad/tampon every hour due to soaking through this is a bleeding emergency and she will need to be seen in our office. Or if she has extended bleeding. Patient is agreeable and verbalizes understanding.  Routing to [place] for final review as Dr.Silva if out of the office. Patient agreeable to disposition. Will close encounter.   I will notify Dr Quincy Simmonds, need to see if she wants the patient to come in or to continue to monitor.

## 2015-06-02 ENCOUNTER — Telehealth: Payer: Self-pay | Admitting: Obstetrics and Gynecology

## 2015-06-02 NOTE — Telephone Encounter (Signed)
Spoke with patient. She called on Monday because she started a cycle after not having one since May. Patient changing pad q 3 hours. No dizziness, weakness or lightheaded. Offered office visit with Dr. Quincy Simmonds for evaluation and patient declines.  Discussed with patient potential irregular cycles related to perimenopausal changes and not considered menopausal until she has not had a cycle in one year.  I advised patient to keep calendar of menses, call back if bleeding continues or is soaking pads or if any abdominal pain or cramping.   I advised I would call back if additional instructions from provider. She verbalizes understanding of symptoms of bleeding emergencies and when to call back to office or go to urgent care or emergency room as needed. Patient is agreeable and verbalizes understanding of plan and will call back if condition changes or worsens.

## 2015-06-02 NOTE — Telephone Encounter (Signed)
I agree with your recommendations.  Encounter closed. 

## 2015-06-02 NOTE — Telephone Encounter (Signed)
Patient said "I am in perimenopause and started my cycle on Monday". "I have not had a cycle since May, is this is normal?. Last seen 01/28/15.

## 2015-09-09 ENCOUNTER — Other Ambulatory Visit: Payer: Self-pay

## 2015-09-09 DIAGNOSIS — Z1231 Encounter for screening mammogram for malignant neoplasm of breast: Secondary | ICD-10-CM

## 2015-10-11 ENCOUNTER — Ambulatory Visit: Payer: 59 | Admitting: Cardiovascular Disease

## 2015-10-14 ENCOUNTER — Ambulatory Visit: Payer: 59 | Admitting: Cardiovascular Disease

## 2015-10-17 ENCOUNTER — Ambulatory Visit: Payer: 59

## 2015-10-18 ENCOUNTER — Ambulatory Visit: Payer: 59 | Admitting: Cardiovascular Disease

## 2015-11-04 ENCOUNTER — Ambulatory Visit: Payer: 59 | Admitting: Obstetrics and Gynecology

## 2015-11-09 ENCOUNTER — Ambulatory Visit (INDEPENDENT_AMBULATORY_CARE_PROVIDER_SITE_OTHER): Payer: 59 | Admitting: Obstetrics and Gynecology

## 2015-11-09 ENCOUNTER — Encounter: Payer: Self-pay | Admitting: Obstetrics and Gynecology

## 2015-11-09 ENCOUNTER — Ambulatory Visit
Admission: RE | Admit: 2015-11-09 | Discharge: 2015-11-09 | Disposition: A | Discharge status: Home / Self Care | Payer: 59 | Source: Ambulatory Visit

## 2015-11-09 VITALS — BP 142/90 | HR 80 | Resp 20 | Ht 62.0 in | Wt 187.0 lb

## 2015-11-09 DIAGNOSIS — Z01419 Encounter for gynecological examination (general) (routine) without abnormal findings: Secondary | ICD-10-CM | POA: Diagnosis not present

## 2015-11-09 DIAGNOSIS — A6004 Herpesviral vulvovaginitis: Secondary | ICD-10-CM

## 2015-11-09 DIAGNOSIS — Z1231 Encounter for screening mammogram for malignant neoplasm of breast: Secondary | ICD-10-CM

## 2015-11-09 DIAGNOSIS — N951 Menopausal and female climacteric states: Secondary | ICD-10-CM | POA: Diagnosis not present

## 2015-11-09 MED ORDER — VALACYCLOVIR HCL 500 MG PO TABS: ORAL_TABLET | ORAL | Status: DC

## 2015-11-09 MED ORDER — PROGESTERONE MICRONIZED 100 MG PO CAPS: 100.0000 mg | ORAL_CAPSULE | Freq: Every day | ORAL | Status: DC

## 2015-11-09 MED ORDER — ESTRADIOL 0.05 MG/24HR TD PTTW: 1.0000 | MEDICATED_PATCH | TRANSDERMAL | Status: DC

## 2015-11-09 NOTE — Patient Instructions (Signed)

## 2015-11-09 NOTE — Progress Notes (Signed)
Patient ID: Allison Colon, female   DOB: Aug 07, 1961, 55 y.o.   MRN: [id number] 55 y.o. [id number] Married [place] American female here for annual exam.    Menses - 2 menses at the beginning of 2016 May 2016, October 2016, December 3 and August 25, 2015.  Joint pain and headaches.  Having a lot of hot flashes. Mood swings are the worse part of her perimenopause.   Wants vit D level checked.   Has HTN.   PCP:  [facility]   Patient's last menstrual period was 08/25/2015.          Sexually active: Yes.   female The current method of family planning is none.   No contraception in about 20 years.  Exercising: No.   Smoker:  no  Health Maintenance: Pap:  05-25-13 Neg:Neg HR HPV History of abnormal Pap:  no MMG:  10-14-14 3D/Density Cat.B/Neg/BiRads1:[facility] HAD APPT. THIS AM AT [facility] Colonoscopy:  12/2014 normal with Dr. Waneta Martins due 12/2024. BMD:   n/a  Result  n/a TDaP:  2011 Screening Labs:  Hb today: PCP, Urine today: unable to void   reports that she has never smoked. She does not have any smokeless tobacco history on file. She reports that she does not drink alcohol or use illicit drugs.  Past Medical History  Diagnosis Date  . Hypertension   . Anemia   . Anxiety   . Fibroid   . Hyperlipidemia   . STD (sexually transmitted disease)     HSV  . Family history of heart disease     Past Surgical History  Procedure Laterality Date  . Pelvic laparoscopy  03/2004    RSO  . Broken leg Bilateral     age 40    Current Outpatient Prescriptions  Medication Sig Dispense Refill  . aspirin EC 325 MG tablet Take 325 mg by mouth at bedtime.    . enalapril (VASOTEC) 10 MG tablet TAKE 1 TABLET BY MOUTH TWICE DAILY 60 tablet 6  . ibuprofen (ADVIL,MOTRIN) 800 MG tablet Take 1 tablet by mouth as needed.    . rosuvastatin (CRESTOR) 10 MG tablet Take 1 tablet (10 mg total) by mouth daily. 90 tablet 3  . valACYclovir (VALTREX) 500 MG tablet One  tablet daily, increase to one bid at outbreak for 3 days 30 tablet 12   No current facility-administered medications for this visit.    Family History  Problem Relation Age of Onset  . Diabetes Mother   . Hypertension Mother   . Hyperlipidemia Mother   . Heart disease Mother   . HIV Father   . Hypertension Father   . Heart disease Sister   . HIV Sister   . Hepatitis Brother   . Gout Brother     ROS:  Pertinent items are noted in HPI.  Otherwise, a comprehensive ROS was negative.  Exam:   BP 142/90 mmHg  Pulse 80  Resp 20  Ht 5\' 2"  (1.575 m)  Wt 187 lb (84.823 kg)  BMI 34.19 kg/m2  LMP 08/25/2015    General appearance: alert, cooperative and appears stated age Head: Normocephalic, without obvious abnormality, atraumatic Neck: no adenopathy, supple, symmetrical, trachea midline and thyroid normal to inspection and palpation Lungs: clear to auscultation bilaterally Breasts: normal appearance, no masses or tenderness, Inspection negative, No nipple retraction or dimpling, No nipple discharge or bleeding, No axillary or supraclavicular adenopathy Heart: regular rate and rhythm Abdomen: soft, non-tender; bowel sounds normal; no masses,  no organomegaly Extremities: extremities normal, atraumatic, no cyanosis or edema Skin: Skin color, texture, turgor normal. No rashes or lesions Lymph nodes: Cervical, supraclavicular, and axillary nodes normal. No abnormal inguinal nodes palpated Neurologic: Grossly normal  Pelvic: External genitalia:  no lesions              Urethra:  normal appearing urethra with no masses, tenderness or lesions              Bartholins and Skenes: normal                 Vagina: normal appearing vagina with normal color and discharge, no lesions              Cervix: no lesions              Pap taken: Yes.   Bimanual Exam:  Uterus:  normal size, contour, position, consistency, mobility, non-tender              Adnexa: normal adnexa and no mass, fullness,  tenderness              Rectovaginal: Yes.  .  Confirms.              Anus:  normal sphincter tone, no lesions  Chaperone was present for exam.  Assessment:   Well woman visit with normal exam. Hx RSO.  Hx HSV. Menopausal symptoms.  HTN.  Plan: Yearly mammogram recommended after age 15.  Recommended self breast exam.  Pap and HR HPV as above. Discussed Calcium, Vitamin D, regular exercise program including cardiovascular and weight bearing exercise. Labs performed.  Yes.  .   See orders. Refills given on medications.  Yes.  .  See orders.  Valtrex, Prometrium, Vivelle Dot 0.05 mg twice weekly.  Discussed risks of DVT, PE, MI, stroke, and breast CA.  Wishes to do a trial of HRT. Follow up in 3 months.  Follow up annually and prn.   Additional counseling given regarding menopausal symptoms.   After visit summary provided.

## 2015-11-10 ENCOUNTER — Ambulatory Visit: Payer: 59

## 2015-11-10 LAB — VITAMIN D 25 HYDROXY (VIT D DEFICIENCY, FRACTURES): VIT D 25 HYDROXY: 14 ng/mL — AB (ref 30–100)

## 2015-11-11 LAB — IPS PAP TEST WITH HPV

## 2015-11-13 ENCOUNTER — Other Ambulatory Visit: Payer: Self-pay | Admitting: Obstetrics and Gynecology

## 2015-11-13 DIAGNOSIS — E559 Vitamin D deficiency, unspecified: Secondary | ICD-10-CM

## 2015-11-16 ENCOUNTER — Encounter: Payer: Self-pay | Admitting: Obstetrics and Gynecology

## 2015-11-17 ENCOUNTER — Telehealth: Payer: Self-pay | Admitting: Emergency Medicine

## 2015-11-17 ENCOUNTER — Other Ambulatory Visit: Payer: Self-pay | Admitting: Obstetrics and Gynecology

## 2015-11-17 DIAGNOSIS — A6004 Herpesviral vulvovaginitis: Secondary | ICD-10-CM

## 2015-11-17 MED ORDER — VITAMIN D (ERGOCALCIFEROL) 1.25 MG (50000 UNIT) PO CAPS: 50000.0000 [IU] | ORAL_CAPSULE | ORAL | Status: DC

## 2015-11-17 MED ORDER — VALACYCLOVIR HCL 500 MG PO TABS: ORAL_TABLET | ORAL | Status: DC

## 2015-11-17 NOTE — Telephone Encounter (Signed)
Call to patient and she is given message from Dr. Quincy Simmonds regarding low vitamin D. She has not read mychart message from Dr. Quincy Simmonds at this time.  She is scheduled for follow up lab appointment with Dr. Quincy Simmonds 02/17/16. She is advised that refills of valrex have been placed as well. Patient agreeable to instructions and follow up as scheduled. Will call back as needed.   Routing to provider for final review. Patient agreeable to disposition. Will close encounter.

## 2015-11-17 NOTE — Telephone Encounter (Signed)
-----   Message from Nunzio Cobbs, MD sent at 11/13/2015 12:51 PM EDT ----- Please inform patient of her Vit D results which were low.  I am recommending Vit D 50,000 IU, orally, weekly for 3 months. #12, RF none. Please sent to pharmacy of choice.  I am recommending a follow up lab visit in 3 months.  I will place this future order.  Please schedule visit. Marland Kitchen  Cc- Marisa Sprinkles

## 2015-11-22 ENCOUNTER — Ambulatory Visit: Payer: 59 | Admitting: Cardiovascular Disease

## 2015-11-30 ENCOUNTER — Ambulatory Visit: Payer: 59 | Admitting: Cardiovascular Disease

## 2015-12-01 ENCOUNTER — Encounter: Payer: Self-pay | Admitting: *Deleted

## 2015-12-02 ENCOUNTER — Encounter: Payer: Self-pay | Admitting: Cardiovascular Disease

## 2015-12-06 ENCOUNTER — Telehealth: Payer: Self-pay | Admitting: Cardiovascular Disease

## 2015-12-06 NOTE — Telephone Encounter (Signed)
Added to med list

## 2015-12-06 NOTE — Telephone Encounter (Signed)
Two of the ingredients are known to increase blood pressure (eleuthero and licorice).  If she is going to take, she should watch her BP as it may start to increase.  Otherwise she should be okay with her meds

## 2015-12-06 NOTE — Telephone Encounter (Signed)
Patient called and notified of Kristin's advice. She voiced understanding.

## 2015-12-06 NOTE — Telephone Encounter (Signed)
Returned call to patient and received info from her about what is in her herbal supplement:  - black cohosh root - blessed thistle (stem, leaf, flower) - dong-quai root - eleuthero root (patient states this is like ginseng) - licorice root - pomegranate seed - sarsaparilla root (patient states this is supposed to be like a diuretic, supposed to balance hormones, sweat promoter)  BP states her sbp is around 130s  Patient has been taking supplement since Sunday - she states she has not had a night sweat or a hot flash since taking.  She states this is an all natural supplement  Instructions say to take 3 capsules twice daily with food - patient has only been taking 2 a day  Will have Erasmo Downer review

## 2015-12-06 NOTE — Telephone Encounter (Signed)
Pt is calling in wanting to speak with a nurse about a herbal supplement she wants to take to help with her Menopause, she says it is called A Change of Life. Please f/u with her

## 2015-12-12 ENCOUNTER — Telehealth: Payer: Self-pay | Admitting: Obstetrics and Gynecology

## 2015-12-12 NOTE — Telephone Encounter (Signed)
Patient has some questions for the nurse about "perimenopause symptoms" she may be having.

## 2015-12-12 NOTE — Telephone Encounter (Signed)
Spoke with patient. She states she called to find out about how long she can expect to have hot flashes and hormone imbalances as she transitions into becoming menopausal.  Patient is advised that all women are different and that we cannot tell her how long she will be feeling vasomotor symptoms.  Patient verbalized understanding.   She states she did not start on HRT that was prescribed to her by Dr. Quincy Simmonds due to concerns about cancer risks.  She is taking and OTC supplement called A Change of Life - menopause supplement  that contains: black cohosh root, blessed thistle (stem, leaf, flower), dong-quai root, eleuthero root, licorice root, pomegranate seed, sarsaparilla root. She states this has been working well for her and decreased hot flashes.  She stopped the supplement for four days and feels pain in her hands and and sometimes a "throb" on top of her head. When asked if this is new for her, she states "oh no this has been going on for a while now."  She is advised to follow up with PCP regarding body pains and headaches. She is advised to call 911 if develops worst headache of her life symptoms or weakness of extremities or any other concerns.  Patient agreeable.   I advised I would call her back with any additional instructions from Dr. Quincy Simmonds, if any.

## 2015-12-13 NOTE — Telephone Encounter (Signed)
It sound like she is responding to the herbal options she is trying.  Natural progesterone cream from a health food store as an option as well.  Call back if her menopausal symptoms are uncontrolled, and she wishes to try other options.  Please keep a bleeding calendar so we can monitor her cycles.

## 2015-12-15 NOTE — Telephone Encounter (Signed)
Message left to return call to [place] at [phone].   Call to work number, voice mail did not specify patient name, did not leave a message. Left message to return my call at patient's mobile number.

## 2015-12-15 NOTE — Telephone Encounter (Signed)
Call to patient and she is given message from Dr. Quincy Simmonds.  Verbalizes understanding and will follow up in June as scheduled or earlier as needed. She has not had a cycle since December. She is advised to keep a calendar of bleeding if it does occur.  She will think about progesterone cream.  Routing to provider for final review. Patient agreeable to disposition. Will close encounter.

## 2015-12-20 ENCOUNTER — Ambulatory Visit: Payer: 59 | Admitting: Cardiovascular Disease

## 2016-01-11 ENCOUNTER — Ambulatory Visit: Payer: 59 | Admitting: Cardiovascular Disease

## 2016-01-17 ENCOUNTER — Encounter: Payer: Self-pay | Admitting: *Deleted

## 2016-01-24 ENCOUNTER — Other Ambulatory Visit: Payer: Self-pay | Admitting: Obstetrics and Gynecology

## 2016-01-25 NOTE — Telephone Encounter (Signed)
Medication refill request: Vitamin D 50000units Last AEX:  11/09/15 Dr. Quincy Simmonds Next AEX: 11/22/16 Last MMG (if hormonal medication request): 11/09/15 BIRADS1 negative Refill authorized: 11/17/15 #12 w/0 refills today please advise patient has lab appt to recheck Vit D level 02/17/16

## 2016-02-10 ENCOUNTER — Telehealth: Payer: Self-pay | Admitting: Obstetrics and Gynecology

## 2016-02-10 ENCOUNTER — Ambulatory Visit: Payer: 59 | Admitting: Obstetrics and Gynecology

## 2016-02-10 ENCOUNTER — Encounter: Payer: Self-pay | Admitting: Obstetrics and Gynecology

## 2016-02-10 NOTE — Telephone Encounter (Signed)
Thank you for the update.  I have closed the encounter.  

## 2016-02-10 NOTE — Telephone Encounter (Signed)
Patient dnka her 3 month recheck appointment today @ 2:30pm. I was unable to reach patient by phone, letter mailed. [facility] policy followed.

## 2016-02-17 ENCOUNTER — Other Ambulatory Visit: Payer: 59

## 2016-02-17 ENCOUNTER — Telehealth: Payer: Self-pay | Admitting: Obstetrics and Gynecology

## 2016-02-17 NOTE — Telephone Encounter (Signed)
Called patient regarding missed lab appointment. Mailbox is full and could not leave a message.

## 2016-02-17 NOTE — Telephone Encounter (Signed)
Please try her again next week.  If no answer, you may close the encounter.

## 2016-02-20 ENCOUNTER — Ambulatory Visit (INDEPENDENT_AMBULATORY_CARE_PROVIDER_SITE_OTHER): Payer: 59 | Admitting: Nurse Practitioner

## 2016-02-20 ENCOUNTER — Encounter: Payer: Self-pay | Admitting: Nurse Practitioner

## 2016-02-20 ENCOUNTER — Telehealth: Payer: Self-pay

## 2016-02-20 ENCOUNTER — Encounter: Payer: Self-pay | Admitting: Obstetrics and Gynecology

## 2016-02-20 VITALS — BP 114/76 | HR 84 | Temp 98.3°F | Ht 62.0 in | Wt 189.0 lb

## 2016-02-20 DIAGNOSIS — N898 Other specified noninflammatory disorders of vagina: Secondary | ICD-10-CM | POA: Diagnosis not present

## 2016-02-20 LAB — POCT URINALYSIS DIPSTICK
BILIRUBIN UA: NEGATIVE
Glucose, UA: NEGATIVE
KETONES UA: NEGATIVE
Nitrite, UA: NEGATIVE
PH UA: 5
Protein, UA: NEGATIVE
Urobilinogen, UA: NEGATIVE

## 2016-02-20 MED ORDER — FLUCONAZOLE 150 MG PO TABS: 150.0000 mg | ORAL_TABLET | Freq: Once | ORAL | Status: DC

## 2016-02-20 MED ORDER — NYSTATIN-TRIAMCINOLONE 100000-0.1 UNIT/GM-% EX OINT: 1.0000 "application " | TOPICAL_OINTMENT | Freq: Two times a day (BID) | CUTANEOUS | Status: DC

## 2016-02-20 NOTE — Telephone Encounter (Signed)
Non-Urgent Medical Question  Message [id number]   From  [place], MD   Sent  02/20/2016 7:37 AM     Dr Quincy Simmonds   Can you please send me a prescription to [facility] for her yeast infection I tried Monistat but that stuff burns [phone] call me if you need to thank you      Responsible Party    Pool - [facility] Clinical Pool No one has taken responsibility for this message.     No actions have been taken on this message.     Patient is scheduled to see Kem Boroughs, FNP at 3 pm today 02/20/2016 for evaluation of symptoms.  Routing to provider for final review. Patient agreeable to disposition. Will close encounter.

## 2016-02-20 NOTE — Progress Notes (Signed)
55 y.o. Married [place] American female [phone] here with complaint of external vaginal symptoms of itching, burning.  She has used OTC Monistat which caused her to have more symptoms and then discharge this am.  Describes discharge as white like the Monistat. Onset of symptoms 4-5 days ago. Denies new personal products or vaginal dryness but she does use a scented body wash.  She also is perimenopausal with LMP 11/26/2015 . No STD concerns. Urinary symptoms none other than suprapubic bladder "pressure: . Contraception is none.  On OTC hormone replacement with some help.  She did not start on HRT as advised by Dr. Quincy Simmonds.   She is S/P pelvic laparotomy with RSO in 2005.  O:  Healthy female WDWN Affect: normal, orientation x 3  Exam: no distress Abdomen: soft and non tender Lymph node: no enlargement or tenderness Pelvic exam: External genital: with several areas of chronic yeast with white thickened skin and linear cuts. BUS: negative Vagina: white discharge noted.  Affirm taken. Cervix: normal, non tender, no CMT No urethral tenderness    A: R/O Vaginitis  R/O UTI  Chronic external yeast symptoms   P: Discussed findings of vaginitis and etiology. Discussed Aveeno or baking soda sitz bath for comfort. Avoid moist clothes or pads for extended period of time. If working out in gym clothes or swim suits for long periods of time change underwear or bottoms of swimsuit if possible. Olive Oil/Coconut Oil use for skin protection prior to activity can be used to external skin.  Rx: Diflucan 150 mg X 2 doses  Rx: Triamcinolone and Nystatin prn BID to external vulvar areas  Will also follow with urine C&S  Follow with Affirm  RV prn

## 2016-02-20 NOTE — Telephone Encounter (Signed)
Patient is scheduled to see Kem Boroughs, FNP at 3 pm today 02/20/2016 for evaluation of symptoms.

## 2016-02-21 ENCOUNTER — Encounter: Payer: Self-pay | Admitting: Nurse Practitioner

## 2016-02-21 ENCOUNTER — Other Ambulatory Visit: Payer: Self-pay | Admitting: Cardiovascular Disease

## 2016-02-21 LAB — WET PREP BY MOLECULAR PROBE
CANDIDA SPECIES: NEGATIVE
GARDNERELLA VAGINALIS: NEGATIVE
TRICHOMONAS VAG: NEGATIVE

## 2016-02-22 ENCOUNTER — Encounter: Payer: Self-pay | Admitting: Nurse Practitioner

## 2016-02-22 ENCOUNTER — Telehealth: Payer: Self-pay

## 2016-02-22 LAB — URINE CULTURE
Colony Count: NO GROWTH
ORGANISM ID, BACTERIA: NO GROWTH

## 2016-02-22 NOTE — Telephone Encounter (Signed)
Attempted to reach patient at number provided [phone]. There was no answer and recording states that the voicemail box is full and is not accepting new messages at this time. Mychart message sent to patient asking her to contact the office to discuss mychart message at her convenience.

## 2016-02-22 NOTE — Telephone Encounter (Signed)
Non-Urgent Medical Question  Message [id number]   From  [name]   To  Kem Boroughs, FNP   Sent  02/22/2016 10:53 AM     I have a question about URINE CULTURE resulted on 02/22/16, 3:19 AM.   Thank you      Responsible Party    Pool - [facility] Clinical Pool No one has taken responsibility for this message.     No actions have been taken on this message.     Notes Recorded by Kem Boroughs, FNP on 02/22/2016 at 9:43 AM Results via my chart:  Allison Colon, The urine culture was negative. No need to treat. Notes Recorded by Kem Boroughs, FNP on 02/21/2016 at 8:40 AM Results via my chart:  Allison Colon, The Wet prep for yeast and bacterial infection was negative. But we are still going to treat the external yeast infection with the Diflucan and Nystatin as discussed. The urine culture is pending.

## 2016-02-22 NOTE — Telephone Encounter (Signed)
Spoke with patient. Advised of results and message as seen below from Kem Boroughs, [place]. All questions answered. Patient is agreeable and verbalizes understanding.  Routing to provider for final review. Patient agreeable to disposition. Will close encounter.

## 2016-02-22 NOTE — Progress Notes (Signed)
Encounter reviewed by Dr. Ronette Hank Amundson C. Silva.  

## 2016-02-27 ENCOUNTER — Encounter: Payer: Self-pay | Admitting: Nurse Practitioner

## 2016-02-27 ENCOUNTER — Telehealth: Payer: Self-pay | Admitting: Cardiovascular Disease

## 2016-02-27 DIAGNOSIS — I10 Essential (primary) hypertension: Secondary | ICD-10-CM

## 2016-02-27 DIAGNOSIS — Z79899 Other long term (current) drug therapy: Secondary | ICD-10-CM

## 2016-02-27 DIAGNOSIS — R350 Frequency of micturition: Secondary | ICD-10-CM

## 2016-02-27 DIAGNOSIS — E785 Hyperlipidemia, unspecified: Secondary | ICD-10-CM

## 2016-02-27 DIAGNOSIS — Z833 Family history of diabetes mellitus: Secondary | ICD-10-CM

## 2016-02-27 NOTE — Telephone Encounter (Signed)
Patient state she would like HgbA1C as well, she is concerned because she has family history as well as frequency urination. rn informed patient no diagnosis for lab, may call primary Patient states she called in her insurance and since she was doing labs,she wanted to it allat the same time . rn informed patient will order lab .

## 2016-02-27 NOTE — Telephone Encounter (Signed)
Mrs. Allison Colon is asking if a lab order for her Lipid and A1C  Be sent to [facility] before her appt on 08/02/017.   Thanks

## 2016-02-29 ENCOUNTER — Telehealth: Payer: Self-pay | Admitting: *Deleted

## 2016-02-29 NOTE — Telephone Encounter (Signed)
Call to patient, voice mail is full. Unable to leave message. Responded to My Chart message instructing patient to call office and speak to triage nurse.

## 2016-02-29 NOTE — Telephone Encounter (Signed)
My Chart messages received from patient on 02-27-16:   >','<< Less Detail',event)" href="javascript:;">More Detail >>  Non-Urgent Medical Question   Allison Colon   Sent: Mon February 27, 2016 11:43 PM   To: P [facility] Clinical Pool     Message    I have a question about WET PREP BY MOLECULAR PROBE resulted on 02/21/16, 7:47 AM.   I still have lower pelvic pressure. I am perimenopausing and don't know if it contributes to this.  It's not hurting, I'm not in pain at all it's just like a "hey I'm still here" type of feeling.      Not sure what it is. It's all the way at the bottom pelvic are where my pubic hair lay...      [id number]   Thanks    [facility] [place] Size    Small Medium Large Extra Extra Large  [name] 02/27/2016  Patient Email MRN:  [id number]   Description: 55 year old female Provider: Kem Boroughs, FNP Department: [facility]      Call Documentation    No notes of this type exist for this encounter.   Encounter MyChart Messages    Read Composed From To Subject   Y 02/27/2016 11:43 PM [name], FNP Non-Urgent Medical Question    Created by    Kem Boroughs, FNP on 02/27/2016 11:43 PM

## 2016-02-29 NOTE — Telephone Encounter (Signed)
See phone note

## 2016-02-29 NOTE — Telephone Encounter (Signed)
I have a question about URINE CULTURE resulted on 02/22/16, 3:19 AM.        Did my test result come back from the scraping that was done along with the urine specimen?

## 2016-03-06 NOTE — Telephone Encounter (Signed)
Follow up call to patient. Voice mail box full. Unable to leave message.   Patient has not responded to My Chart message and unable to reach her. Ok to close encounter and wait for her to call?

## 2016-03-07 NOTE — Telephone Encounter (Signed)
Yes.  OK to close encounter. 

## 2016-03-07 NOTE — Telephone Encounter (Signed)
Encounter closed

## 2016-03-19 ENCOUNTER — Telehealth: Payer: Self-pay | Admitting: Obstetrics and Gynecology

## 2016-03-19 NOTE — Telephone Encounter (Signed)
Left message to call Kaitlyn at [phone]. 

## 2016-03-19 NOTE — Telephone Encounter (Signed)
Please contact patient to request she make an appointment to recheck her vitamin D level.

## 2016-03-20 ENCOUNTER — Other Ambulatory Visit: Payer: Self-pay | Admitting: Cardiovascular Disease

## 2016-03-20 NOTE — Telephone Encounter (Signed)
REFILL 

## 2016-03-28 ENCOUNTER — Ambulatory Visit: Payer: 59 | Admitting: Cardiovascular Disease

## 2016-04-03 NOTE — Telephone Encounter (Signed)
Left message for patient to call office and schedule lab appointment to recheck Vit D levels. (ok to leave detailed message per [facility]).

## 2016-04-10 NOTE — Telephone Encounter (Signed)
Called patient back. Voicemail is full and cannot leave a message. Letter sent. Routed to provider.

## 2016-04-18 ENCOUNTER — Ambulatory Visit: Payer: 59 | Admitting: Cardiovascular Disease

## 2016-04-19 ENCOUNTER — Encounter: Payer: Self-pay | Admitting: *Deleted

## 2016-04-19 NOTE — Progress Notes (Signed)
Letter mailed

## 2016-04-26 ENCOUNTER — Other Ambulatory Visit: Payer: Self-pay | Admitting: Cardiovascular Disease

## 2016-05-02 ENCOUNTER — Encounter: Payer: Self-pay | Admitting: Obstetrics and Gynecology

## 2016-05-03 ENCOUNTER — Telehealth: Payer: Self-pay | Admitting: Obstetrics and Gynecology

## 2016-05-03 ENCOUNTER — Other Ambulatory Visit: Payer: Self-pay | Admitting: Obstetrics and Gynecology

## 2016-05-03 DIAGNOSIS — N926 Irregular menstruation, unspecified: Secondary | ICD-10-CM

## 2016-05-03 NOTE — Telephone Encounter (Signed)
Patient says she would like to have her hormone levels checked because of bad headaches.

## 2016-05-03 NOTE — Telephone Encounter (Signed)
Returned call to patient. Patient states she is having headaches that she didn't have before. She states she first thought the headaches were related to her allergies, but that she took her allergy medication and the headaches did not go away. Patient states she gets her period every 3-4 months so she thinks the headaches may be related to that. "I know things are fluctuating in my body. I never had headaches before and now I get motion sickness too when riding in the car and I never had that before either." Patient states she had labs drawn 2 years ago to determine menopausal status, and she would like to have those drawn again. Office visit offered to patient to discuss symptoms with Dr. Quincy Simmonds, but patient declines stating, "Dr. Quincy Simmonds already knows all of my symptoms, we have discussed them before, and she wants me to come in anyway to have my vitamin d checked." Patient would just like to come in for lab appointment. RN advised this message would be sent to Dr. Quincy Simmonds for review and our office would return call with recommendations from Dr. Quincy Simmonds. Patient agreeable.   Routing to provider for review.

## 2016-05-03 NOTE — Telephone Encounter (Signed)
I placed orders for an [facility] and estradiol level to be done when she has her vit D level rechecked.

## 2016-05-07 NOTE — Telephone Encounter (Signed)
Attempted to reach patient. Call went straight to voicemail and said voicemail box was full. Unable to leave message at this time. Patient needs to schedule lab appointment.

## 2016-05-08 NOTE — Telephone Encounter (Signed)
Call to patient. Call went straight to voicemail and said "mailbox is full could not accept any messages."

## 2016-05-09 ENCOUNTER — Other Ambulatory Visit: Payer: Self-pay | Admitting: Obstetrics and Gynecology

## 2016-05-09 ENCOUNTER — Other Ambulatory Visit: Payer: Self-pay | Admitting: Cardiovascular Disease

## 2016-05-10 NOTE — Telephone Encounter (Signed)
Medication refill request: Vitamin D 50000 units Last AEX:  11/09/15 BS Next AEX: 11/22/16 Last MMG (if hormonal medication request): 11/09/15 BIRADS1 negative Refill authorized: 01/25/16 #4 w/0 refills; today please advise, last Vit D check 11/09/15

## 2016-05-11 NOTE — Telephone Encounter (Signed)
Tried calling patient, no answer and unable to leave a voicemail.

## 2016-05-14 NOTE — Telephone Encounter (Signed)
Tried calling patient once more to schedule Vitamin D recheck. No answer, and unable to leave voicemail.

## 2016-05-15 NOTE — Telephone Encounter (Signed)
Still could not reach patient.

## 2016-05-17 NOTE — Telephone Encounter (Signed)
I have attempted to contact this patient by phone with the following results: Mailbox full, unable to leave message.    Third attempt to call patient.  Patient has not returned for lab appointment.    Please advise next step.  Thank you.

## 2016-05-17 NOTE — Telephone Encounter (Signed)
I closed the encounter

## 2016-05-25 ENCOUNTER — Other Ambulatory Visit: Payer: 59

## 2016-05-29 ENCOUNTER — Encounter: Payer: Self-pay | Admitting: Cardiovascular Disease

## 2016-05-31 ENCOUNTER — Ambulatory Visit: Payer: 59 | Admitting: Physician Assistant

## 2016-06-12 ENCOUNTER — Ambulatory Visit: Payer: 59 | Admitting: Cardiovascular Disease

## 2016-07-24 ENCOUNTER — Other Ambulatory Visit: Payer: Self-pay | Admitting: Cardiovascular Disease

## 2016-07-25 ENCOUNTER — Encounter: Payer: Self-pay | Admitting: Obstetrics and Gynecology

## 2016-07-26 ENCOUNTER — Telehealth: Payer: Self-pay

## 2016-07-26 NOTE — Telephone Encounter (Signed)
Telephone encounter created to review with Dr.Silva. 
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
## 2016-07-26 NOTE — Telephone Encounter (Signed)
If continues with menses sooner than every 21 days, needs office visit.  Would observe for now.

## 2016-07-27 NOTE — Telephone Encounter (Signed)
Spoke with patient to discuss multiple MyChart message concerns. Please see MyChart encounters dated 07/25/2016. Offered to schedule OV with Dr.Silva to discuss concerns regarding menses, but patient declines. Reports her LMP started on 07/15/2016 and lasted 2-3 days. On 07/25/2016 with using the restroom patient had 1 episode of spotting. Took UPT which was negative. No further bleeding. Denies any pain, cramping, heavy bleeding, or urinary symptoms. Advised of recommendations as seen below from [place]. Patient would like to monitor for any future bleeding or new symptoms. Will call to schedule an appointment for further evaluation if needed.   Routing to provider for final review. Patient agreeable to disposition. Will close encounter.

## 2016-08-27 DIAGNOSIS — R7989 Other specified abnormal findings of blood chemistry: Secondary | ICD-10-CM

## 2016-08-27 DIAGNOSIS — R7309 Other abnormal glucose: Secondary | ICD-10-CM

## 2016-08-27 DIAGNOSIS — R768 Other specified abnormal immunological findings in serum: Secondary | ICD-10-CM

## 2016-08-27 HISTORY — DX: Other specified abnormal immunological findings in serum: R76.8

## 2016-08-27 HISTORY — DX: Other specified abnormal findings of blood chemistry: R79.89

## 2016-08-27 HISTORY — DX: Other abnormal glucose: R73.09

## 2016-09-02 ENCOUNTER — Other Ambulatory Visit (INDEPENDENT_AMBULATORY_CARE_PROVIDER_SITE_OTHER): Payer: Self-pay | Admitting: Orthopaedic Surgery

## 2016-09-02 ENCOUNTER — Other Ambulatory Visit: Payer: Self-pay | Admitting: Obstetrics and Gynecology

## 2016-09-02 DIAGNOSIS — A6004 Herpesviral vulvovaginitis: Secondary | ICD-10-CM

## 2016-09-03 ENCOUNTER — Other Ambulatory Visit: Payer: Self-pay | Admitting: Cardiovascular Disease

## 2016-09-03 NOTE — Telephone Encounter (Signed)
Medication refill request: Valacyclovir Last AEX:  11/09/15 BS Next AEX: 11/22/16 BS Last MMG (if hormonal medication request): 11/09/15 BIRADS1, [facility] Refill authorized: 11/17/15 #30 11R. Please advise. Thank you.

## 2016-09-04 ENCOUNTER — Telehealth: Payer: Self-pay | Admitting: Obstetrics and Gynecology

## 2016-09-04 ENCOUNTER — Telehealth: Payer: Self-pay | Admitting: Cardiovascular Disease

## 2016-09-04 NOTE — Telephone Encounter (Signed)
Patient's next appointment is 11/22/16 for her aex. Patient is asking is she could have the "lining checked in her uterus at her aex? Patient is also asking to have blood work done at her aex to "check her menopause level". Patient is asking if checking "the lining in her uterus is a procedure"?

## 2016-09-04 NOTE — Telephone Encounter (Signed)
Patient wants to know when she had her last mammogram.

## 2016-09-04 NOTE — Telephone Encounter (Signed)
Left message to call Sharee Pimple at [phone].   Last MMG 11/09/15

## 2016-09-10 NOTE — Telephone Encounter (Signed)
Left message to call Geanine Vandekamp at [phone].  

## 2016-09-14 NOTE — Telephone Encounter (Signed)
Dr. Quincy Simmonds -attempted to contact patient x2 with no return call, please advise?

## 2016-09-16 NOTE — Telephone Encounter (Signed)
I have closed the encounter. 

## 2016-09-25 ENCOUNTER — Other Ambulatory Visit: Payer: Self-pay | Admitting: Obstetrics and Gynecology

## 2016-09-25 DIAGNOSIS — Z1231 Encounter for screening mammogram for malignant neoplasm of breast: Secondary | ICD-10-CM

## 2016-09-26 NOTE — Telephone Encounter (Signed)
Close encounter 

## 2016-10-03 ENCOUNTER — Ambulatory Visit: Payer: 59 | Admitting: Cardiovascular Disease

## 2016-10-17 ENCOUNTER — Ambulatory Visit: Payer: 59 | Admitting: Cardiovascular Disease

## 2016-10-26 ENCOUNTER — Encounter: Payer: Self-pay | Admitting: Cardiovascular Disease

## 2016-10-26 ENCOUNTER — Ambulatory Visit (INDEPENDENT_AMBULATORY_CARE_PROVIDER_SITE_OTHER): Payer: 59 | Admitting: Cardiovascular Disease

## 2016-10-26 VITALS — BP 123/85 | HR 90 | Ht 61.0 in | Wt 195.0 lb

## 2016-10-26 DIAGNOSIS — I10 Essential (primary) hypertension: Secondary | ICD-10-CM

## 2016-10-26 DIAGNOSIS — E785 Hyperlipidemia, unspecified: Secondary | ICD-10-CM | POA: Diagnosis not present

## 2016-10-26 MED ORDER — ROSUVASTATIN CALCIUM 10 MG PO TABS: 10.0000 mg | ORAL_TABLET | Freq: Every day | ORAL | 11 refills | Status: DC

## 2016-10-26 MED ORDER — ENALAPRIL MALEATE 10 MG PO TABS: 10.0000 mg | ORAL_TABLET | Freq: Two times a day (BID) | ORAL | 11 refills | Status: DC

## 2016-10-26 MED ORDER — ROSUVASTATIN CALCIUM 10 MG PO TABS: 10.0000 mg | ORAL_TABLET | Freq: Every day | ORAL | 0 refills | Status: DC

## 2016-10-26 MED ORDER — ENALAPRIL MALEATE 10 MG PO TABS: 10.0000 mg | ORAL_TABLET | Freq: Two times a day (BID) | ORAL | 0 refills | Status: DC

## 2016-10-26 NOTE — Assessment & Plan Note (Signed)
History of hypertension with blood pressure measured today at 123/85. She is on enalapril. Continue current meds at current dosing

## 2016-10-26 NOTE — Assessment & Plan Note (Signed)
History of hyperlipidemia on Crestor which she ran out of several weeks ago. We Will renew her prescription and recheck a lipid liver profile 2 months.
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
## 2016-10-26 NOTE — Patient Instructions (Signed)

## 2016-11-02 ENCOUNTER — Encounter: Payer: Self-pay | Admitting: Cardiovascular Disease

## 2016-11-09 ENCOUNTER — Ambulatory Visit
Admission: RE | Admit: 2016-11-09 | Discharge: 2016-11-09 | Disposition: A | Discharge status: Home / Self Care | Payer: 59 | Source: Ambulatory Visit | Attending: Obstetrics and Gynecology | Admitting: Obstetrics and Gynecology

## 2016-11-09 DIAGNOSIS — Z1231 Encounter for screening mammogram for malignant neoplasm of breast: Secondary | ICD-10-CM

## 2016-11-22 ENCOUNTER — Ambulatory Visit: Payer: 59 | Admitting: Obstetrics and Gynecology

## 2016-12-13 NOTE — Progress Notes (Signed)
56 y.o. [id number] Married [place] American female here for annual exam.    Menses in Jan and April.  Has cycles about 3 - 4 times per year.  Kendrick Fries, which helps.  No "crazy" hot flashes. No vaginal dryness.   Having headaches that come and go. Had an aura.   Has joint pain.   Some depression symptoms.  Decreased energy to work. Not sleeping well.  No prior dx of depression or anxiety.  Denies suicidal ideation.  Stressing with family.   Gained weight.   Lipids with cardiologist.   PCP:   None. Cardiology:  Quay Burow, MD  Patient's last menstrual period was 11/26/2016 (exact date).           Sexually active: Yes.    The current method of family planning is none.    Exercising: No.  exercise Smoker:  no  Health Maintenance: Pap:11-09-15 Neg:Neg HR HPV; 05-25-13 Neg:Neg HR HPV History of abnormal Pap:  no MMG: 11-09-16 Density B/Neg/BiRads1:TBC Colonoscopy: 12/2014 normal with Dr. Waneta Martins due 12/2024. BMD:   n/a  Result  n/a TDaP: 2011 Gardasil:   no HIV: not done Hep C: not done Screening Labs:  Hb today: none, Urine today: none   reports that she has never smoked. She has never used smokeless tobacco. She reports that she does not drink alcohol or use drugs.  Past Medical History:  Diagnosis Date  . Anemia   . Anxiety   . Family history of heart disease   . Fibroid   . Hyperlipidemia   . Hypertension   . STD (sexually transmitted disease)    HSV    Past Surgical History:  Procedure Laterality Date  . broken leg Bilateral    age 62  . PELVIC LAPAROSCOPY  12/2003   RSO    Current Outpatient Prescriptions  Medication Sig Dispense Refill  . aspirin EC 325 MG tablet Take 325 mg by mouth at bedtime.    . enalapril (VASOTEC) 10 MG tablet Take 1 tablet (10 mg total) by mouth 2 (two) times daily. 30 tablet 11  . ibuprofen (ADVIL,MOTRIN) 800 MG tablet Take 1 tablet by mouth as needed.    . rosuvastatin (CRESTOR) 10 MG tablet Take 1 tablet (10 mg  total) by mouth daily. 30 tablet 11  . valACYclovir (VALTREX) 500 MG tablet TAKE 1 TABLET BY MOUTH DAILY. INCREASE TO 1 TABLET TWICE DAILY AT OUTBREAK FOR 3 DAYS 30 tablet 2   No current facility-administered medications for this visit.     Family History  Problem Relation Age of Onset  . Diabetes Mother   . Hypertension Mother   . Hyperlipidemia Mother   . Heart disease Mother   . HIV Father   . Hypertension Father   . Heart disease Sister   . HIV Sister   . Hepatitis Brother   . Gout Brother     deceased    ROS:  Pertinent items are noted in HPI.  Otherwise, a comprehensive ROS was negative.  Exam:   BP 118/80   Pulse 68   Resp 16   Ht 5' 1.75" (1.568 m)   Wt 191 lb (86.6 kg)   LMP 11/26/2016 (Exact Date)   BMI 35.22 kg/m     General appearance: alert, cooperative and appears stated age Head: Normocephalic, without obvious abnormality, atraumatic Neck: no adenopathy, supple, symmetrical, trachea midline and thyroid normal to inspection and palpation Lungs: clear to auscultation bilaterally Breasts: normal appearance, no masses or tenderness, No nipple  retraction or dimpling, No nipple discharge or bleeding, No axillary or supraclavicular adenopathy Heart: regular rate and rhythm Abdomen: soft, non-tender; no masses, no organomegaly Extremities: extremities normal, atraumatic, no cyanosis or edema Skin: Skin color, texture, turgor normal. No rashes or lesions Lymph nodes: Cervical, supraclavicular, and axillary nodes normal. No abnormal inguinal nodes palpated Neurologic: Grossly normal  Pelvic: External genitalia:  no lesions              Urethra:  normal appearing urethra with no masses, tenderness or lesions              Bartholins and Skenes: normal                 Vagina: normal appearing vagina with normal color and discharge, no lesions              Cervix: no lesions              Pap taken: No. Bimanual Exam:  Uterus:  normal size, contour, position,  consistency, mobility, non-tender              Adnexa: no mass, fullness, tenderness              Rectal exam: Yes.  .  Confirms.              Anus:  normal sphincter tone, no lesions  Chaperone was present for exam.  Assessment:   Well woman visit with normal exam. Hx RSO.  Hx HSV. Menopausal symptoms.  HTN. Low vit D.  Joint pain.  Situational stress.  Migraine with aura?  Plan: Mammogram screening discussed. Recommended self breast awareness. Pap and HR HPV as above. Guidelines for Calcium, Vitamin D, regular exercise program including cardiovascular and weight bearing exercise. Routine labs and ANA.  Information for counseling at [facility].  Refill of Valtrex.  Follow up annually and prn.       After visit summary provided.

## 2016-12-14 ENCOUNTER — Ambulatory Visit (INDEPENDENT_AMBULATORY_CARE_PROVIDER_SITE_OTHER): Payer: 59 | Admitting: Obstetrics and Gynecology

## 2016-12-14 ENCOUNTER — Encounter: Payer: Self-pay | Admitting: Obstetrics and Gynecology

## 2016-12-14 VITALS — BP 118/80 | HR 68 | Resp 16 | Ht 61.75 in | Wt 191.0 lb

## 2016-12-14 DIAGNOSIS — E559 Vitamin D deficiency, unspecified: Secondary | ICD-10-CM

## 2016-12-14 DIAGNOSIS — M2550 Pain in unspecified joint: Secondary | ICD-10-CM | POA: Diagnosis not present

## 2016-12-14 DIAGNOSIS — N951 Menopausal and female climacteric states: Secondary | ICD-10-CM

## 2016-12-14 DIAGNOSIS — Z01419 Encounter for gynecological examination (general) (routine) without abnormal findings: Secondary | ICD-10-CM | POA: Diagnosis not present

## 2016-12-14 DIAGNOSIS — R7989 Other specified abnormal findings of blood chemistry: Secondary | ICD-10-CM

## 2016-12-14 DIAGNOSIS — A6004 Herpesviral vulvovaginitis: Secondary | ICD-10-CM | POA: Diagnosis not present

## 2016-12-14 LAB — COMPREHENSIVE METABOLIC PANEL
ALT: 13 U/L (ref 6–29)
AST: 20 U/L (ref 10–35)
Albumin: 4.5 g/dL (ref 3.6–5.1)
Alkaline Phosphatase: 53 U/L (ref 33–130)
BUN: 12 mg/dL (ref 7–25)
CALCIUM: 9.8 mg/dL (ref 8.6–10.4)
CHLORIDE: 104 mmol/L (ref 98–110)
CO2: 24 mmol/L (ref 20–31)
Creat: 0.79 mg/dL (ref 0.50–1.05)
GLUCOSE: 102 mg/dL — AB (ref 65–99)
POTASSIUM: 4.5 mmol/L (ref 3.5–5.3)
Sodium: 138 mmol/L (ref 135–146)
Total Bilirubin: 0.2 mg/dL (ref 0.2–1.2)
Total Protein: 7.5 g/dL (ref 6.1–8.1)

## 2016-12-14 LAB — CBC
HCT: 42 % (ref 35.0–45.0)
HEMOGLOBIN: 13.9 g/dL (ref 11.7–15.5)
MCH: 28.4 pg (ref 27.0–33.0)
MCHC: 33.1 g/dL (ref 32.0–36.0)
MCV: 85.9 fL (ref 80.0–100.0)
MPV: 9.6 fL (ref 7.5–12.5)
Platelets: 344 10*3/uL (ref 140–400)
RBC: 4.89 MIL/uL (ref 3.80–5.10)
RDW: 14.6 % (ref 11.0–15.0)
WBC: 9.9 10*3/uL (ref 3.8–10.8)

## 2016-12-14 MED ORDER — VALACYCLOVIR HCL 500 MG PO TABS: ORAL_TABLET | ORAL | 2 refills | Status: AC

## 2016-12-14 NOTE — Patient Instructions (Signed)

## 2016-12-15 LAB — HEMOGLOBIN A1C
HEMOGLOBIN A1C: 5.7 % — AB (ref <5.7)
MEAN PLASMA GLUCOSE: 117 mg/dL

## 2016-12-15 LAB — FOLLICLE STIMULATING HORMONE: FSH: 60 m[IU]/mL

## 2016-12-15 LAB — TSH: TSH: 1.11 m[IU]/L

## 2016-12-15 LAB — VITAMIN D 25 HYDROXY (VIT D DEFICIENCY, FRACTURES): VIT D 25 HYDROXY: 21 ng/mL — AB (ref 30–100)

## 2016-12-15 LAB — ESTRADIOL: Estradiol: 24 pg/mL

## 2016-12-17 LAB — ANTI-NUCLEAR AB-TITER (ANA TITER): ANA Titer 1: 1:80 {titer} — ABNORMAL HIGH

## 2016-12-17 LAB — ANA: Anti Nuclear Antibody(ANA): POSITIVE — AB

## 2016-12-18 ENCOUNTER — Encounter: Payer: Self-pay | Admitting: Obstetrics and Gynecology

## 2016-12-18 ENCOUNTER — Telehealth: Payer: Self-pay | Admitting: *Deleted

## 2016-12-18 DIAGNOSIS — R768 Other specified abnormal immunological findings in serum: Secondary | ICD-10-CM

## 2016-12-18 MED ORDER — VITAMIN D (ERGOCALCIFEROL) 1.25 MG (50000 UNIT) PO CAPS: 50000.0000 [IU] | ORAL_CAPSULE | ORAL | 0 refills | Status: DC

## 2016-12-18 NOTE — Telephone Encounter (Signed)
Patient calling back. Patient states she reviewed lab results on MyChart has some additional questuions. Patient states she drinks 3 pepsi a day and would like to know if this contributes to diabetes -reviewed Dr. Elza Rafter recommendations for weight loss, low sugar, low carb and exercise to reduce risk of diabetes. Patient states she plans to make some changes and f/u with pcp in a few months for a hgbA1C recheck. Patient asking what she can do for low Vit D in future? Advised patient Vit D rich food and natural sunlight can help increase Vit D. Advised patient would update Dr. Quincy Simmonds and return call with amy additional recommendations, patient is agreeable.  Routing to provider for final review. Patient is agreeable to disposition. Will close encounter.

## 2016-12-18 NOTE — Telephone Encounter (Signed)
-----   Message from Nunzio Cobbs, MD sent at 12/18/2016  9:21 AM EDT ----- Please contact patient with results. FSH and estradiol indicate menopause.  Her vitamin D level is low, and I am recommending Vit D 50,000 IU every 2 weeks for 3 months.  Please send to her pharmacy.  She will need an appointment for a lab recheck in 3 months.  I will place a future order.   Her hemoglobin A1C is elevated at 5.7.  This puts her in a prediabetic range.  She can lower this through weight loss, a low sugar and low carb diet, and exercise.  She will want to do this to reduce risk of diabetes.   Her ANA was elevated, and this may indicate an underlying autoimmune cause for her joint pain.  The joint pain may not be due to menopause. I would like for her to have a referral to rheumatology.  Dr. Estanislado Pandy or Dr. Amil Amen.  Please place the referral.  Her thyroid and CBC were normal.

## 2016-12-18 NOTE — Addendum Note (Signed)
Addended by: Yisroel Ramming, Dietrich Pates E on: 12/18/2016 09:21 AM   Modules accepted: Orders

## 2016-12-18 NOTE — Telephone Encounter (Signed)
Spoke with patient, advised of results and recommendations as seen below per Dr. Quincy Simmonds. Patient states she believes joint pain is related to age, but will accept referral to rheumatology for further evaluation. Patient does not have preference of provider or location.  Patient scheduled for 3 month vitamin D recheck on 03/27/17 at 10am. Rx for Vit D to verified pharmacy on file. Referral placed to Dr. Estanislado Pandy. Advised patient our referral department would return call with scheduling information for Dr. Estanislado Pandy. Patient verbalizes understanding and is agreeable.   Routing to provider for final review. Patient is agreeable to disposition. Will close encounter.   Cc: Theresia Lo

## 2016-12-18 NOTE — Telephone Encounter (Signed)
Left message to call [name] at [phone].  

## 2017-01-04 DIAGNOSIS — M2550 Pain in unspecified joint: Secondary | ICD-10-CM | POA: Insufficient documentation

## 2017-01-04 DIAGNOSIS — R768 Other specified abnormal immunological findings in serum: Secondary | ICD-10-CM | POA: Insufficient documentation

## 2017-01-04 NOTE — Progress Notes (Signed)
Office Visit Note  Patient: Allison Colon             Date of Birth: [birth year]           MRN: [id number]             PCP: Patient, No Pcp Per Referring: No ref. provider found Visit Date: 01/14/2017 Occupation: Accounting    Subjective:  Pain of the Right Hand; Pain of the Left Hand; and New Patient (Initial Visit) (hands feel stiff)   History of Present Illness: Allison Colon is a 56 y.o. female seen in consultation per request of Dr. Quincy Simmonds for evaluation of positive ANA. According to patient her symptoms started in 2007 with left knee joint pain. At the time she was seen by an orthopedic surgeon in [place]. Patient reports that her x-rays were unremarkable. In 2011 she moved to [place]. In 2015 she states she started having anxiety attacks and she started missing her periods with some headaches. She's also started noticing increasing stiffness in her hands and left knee joint pain recurred. She took ibuprofen couple of times and the symptoms improved over time. With the hand pain and stiffness persists. She states she was also seen by Dr. Rosamaria Lints at the time but did x-ray of her knee joint and told her that the x-ray wasn't unremarkable although she probably had early osteoarthritis. She states she continues to have some intermittent joint pain. She developed left third trigger finger for which she had cortisone injection by sports medicine physician and the symptoms improved for short time and then recurred. She continues to have some pain and stiffness in her bilateral hands. She has noticed some swelling in her bilateral comp and a small cyst on her left second PIP joint. She was recently seen by her GYN who did lab work and her ANA was positive. For this  reason she was referred to me.  Activities of Daily Living:  Patient reports morning stiffness for 0 minute.   Patient Denies nocturnal pain.  Difficulty dressing/grooming: Denies Difficulty climbing  stairs: Denies Difficulty getting out of chair: Denies Difficulty using hands for taps, buttons, cutlery, and/or writing: Denies   Review of Systems  Constitutional: Positive for weight loss. Negative for fatigue, night sweats, weight gain and weakness.       Intentional weight loss  HENT: Negative for mouth sores, trouble swallowing, trouble swallowing, mouth dryness and nose dryness.   Eyes: Negative for pain, redness, visual disturbance and dryness.  Respiratory: Negative for cough, shortness of breath and difficulty breathing.   Cardiovascular: Negative for chest pain, palpitations, hypertension, irregular heartbeat and swelling in legs/feet.  Gastrointestinal: Negative for blood in stool, constipation and diarrhea.  Endocrine: Negative for increased urination.  Genitourinary: Negative for vaginal dryness.  Musculoskeletal: Positive for arthralgias and joint pain. Negative for joint swelling, myalgias, muscle weakness, morning stiffness, muscle tenderness and myalgias.  Skin: Negative for color change, rash, hair loss, skin tightness, ulcers and sensitivity to sunlight.  Allergic/Immunologic: Negative for susceptible to infections.  Neurological: Negative for dizziness, memory loss and night sweats.  Hematological: Negative for swollen glands.  Psychiatric/Behavioral: Positive for sleep disturbance. Negative for depressed mood. The patient is not nervous/anxious.     PMFS History:  Patient Active Problem List   Diagnosis Date Noted  . History of vitamin D deficiency 01/14/2017  . ANA positive 01/04/2017  . Multiple joint pain 01/04/2017  . Essential hypertension 10/12/2014  . Hyperlipidemia 10/12/2014  . HSV-2 (  herpes simplex virus 2) infection 03/24/2014    Class: History of    Past Medical History:  Diagnosis Date  . Anemia   . Anxiety   . Elevated antinuclear antibody (ANA) level 2018  . Elevated hemoglobin A1c 2018  . Family history of heart disease   . Fibroid   .  Hyperlipidemia   . Hypertension   . Low vitamin D level 2018  . STD (sexually transmitted disease)    HSV    Family History  Problem Relation Age of Onset  . Diabetes Mother   . Hypertension Mother   . Hyperlipidemia Mother   . Heart disease Mother   . HIV Father   . Hypertension Father   . Heart disease Sister   . HIV Sister   . Hepatitis Brother   . Gout Brother        deceased   Past Surgical History:  Procedure Laterality Date  . broken leg Bilateral    age 29  . PELVIC LAPAROSCOPY  12/2003   RSO   Social History   Social History Narrative  . No narrative on file     Objective: Vital Signs: BP 132/80 (BP Location: Right Arm)   Pulse 80   Resp 16   Ht 5\' 1"  (1.549 m)   Wt 190 lb (86.2 kg)   BMI 35.90 kg/m    Physical Exam  Constitutional: She is oriented to person, place, and time. She appears well-developed and well-nourished.  HENT:  Head: Normocephalic and atraumatic.  Eyes: Conjunctivae and EOM are normal.  Neck: Normal range of motion.  Cardiovascular: Normal rate, regular rhythm, normal heart sounds and intact distal pulses.   Pulmonary/Chest: Effort normal and breath sounds normal.  Abdominal: Soft. Bowel sounds are normal.  Lymphadenopathy:    She has no cervical adenopathy.  Neurological: She is alert and oriented to person, place, and time.  Skin: Skin is warm and dry. Capillary refill takes less than 2 seconds.  Psychiatric: She has a normal mood and affect. Her behavior is normal.  Nursing note and vitals reviewed.    Musculoskeletal Exam: C-spine, thoracic, lumbar spine good range of motion. Shoulder joints elbow joints wrist joint MCPs PIPs DIPs are good range of motion. She has tenderness over bilateral PIP joints. She also had mucinous cyst over bilateral first and left second PIP joint. Hip joints knee joints ankles MTPs PIPs are good range of motion with no synovitis.  CDAI Exam: No CDAI exam completed.    Investigation: Findings:   12/14/2016 labs ANA positive titer 1:80, Vitamin D low 21, TSH normal, CBC Normal, CMP normal except for glucose elevated 102   CBC Latest Ref Rng & Units 12/14/2016 09/22/2014 12/14/2010  WBC 3.8 - 10.8 K/uL 9.9 9.5 -  Hemoglobin 11.7 - 15.5 g/dL 13.9 13.6 15.3(H)  Hematocrit 35.0 - 45.0 % 42.0 40.7 45.0  Platelets 140 - 400 K/uL 344 253 -   CMP Latest Ref Rng & Units 12/14/2016 10/12/2014 09/22/2014  Glucose 65 - 99 mg/dL 102(H) - 94  BUN 7 - 25 mg/dL 12 - 10  Creatinine 0.50 - 1.05 mg/dL 0.79 - 0.75  Sodium 135 - 146 mmol/L 138 - 142  Potassium 3.5 - 5.3 mmol/L 4.5 - 4.2  Chloride 98 - 110 mmol/L 104 - 106  CO2 20 - 31 mmol/L 24 - 24  Calcium 8.6 - 10.4 mg/dL 9.8 - 9.7  Total Protein 6.1 - 8.1 g/dL 7.5 6.8 -  Total Bilirubin 0.2 -  1.2 mg/dL 0.2 0.5 -  Alkaline Phos 33 - 130 U/L 53 45 -  AST 10 - 35 U/L 20 20 -  ALT 6 - 29 U/L 13 15 -     Imaging: No results found.  Speciality Comments: No specialty comments available.    Procedures:  No procedures performed Allergies: Patient has no known allergies.   Assessment / Plan:     Visit Diagnoses: ANA positive: Patient has no clinical features of autoimmune disease on examination. There is no history of Raynaud's phenomenon, photosensitivity, malar rash. She is some arthralgias mostly limited to her hands. I'll obtain some labs to look for any underlying autoimmune process. I will check x-ray of her bilateral hands today.  Pain hands: She has DIP PIP thickening bilaterally with mucinous cyst present over her bilateral first and left second PIP joint. I do not see any synovitis on examination.X-rays obtained today showed mild osteoarthritic changes only.  Pain in bilateral knee joints: She has some crepitus on examination no warmth swelling or effusion was noted. I'll obtain x-ray of bilateral knee joints today. X-rays obtained today revealed mild osteoarthritis and mild chondromalacia patella.   History of hypertension: Her blood  pressure is well controlled.  History of herpes simplex infection  Hyperlipidemia  History of vitamin D deficiency : She is on vitamin D.   Orders: Orders Placed This Encounter  Procedures  . XR Hand 2 View Left  . XR Hand 2 View Right  . XR KNEE 3 VIEW RIGHT  . XR KNEE 3 VIEW LEFT  . [id number] ENA PANEL  . C3 and C4  . Cardiolipin antibodies, IgG, IgM, IgA  . Lupus anticoagulant panel  . Beta-2 glycoprotein antibodies  . Rheumatoid factor  . Cyclic citrul peptide antibody, IgG  . Angiotensin converting enzyme  . CK   No orders of the defined types were placed in this encounter.   Face-to-face time spent with patient was 50 minutes. 50% of time was spent in counseling and coordination of care.  Follow-Up Instructions: Return for Polyarthralgia, positive ANA.   Bo Merino, MD  Note - This record has been created using Editor, commissioning.  Chart creation errors have been sought, but may not always  have been located. Such creation errors do not reflect on  the standard of medical care.

## 2017-01-09 ENCOUNTER — Other Ambulatory Visit: Payer: Self-pay | Admitting: Obstetrics and Gynecology

## 2017-01-14 ENCOUNTER — Encounter: Payer: Self-pay | Admitting: Rheumatology

## 2017-01-14 ENCOUNTER — Ambulatory Visit (INDEPENDENT_AMBULATORY_CARE_PROVIDER_SITE_OTHER): Payer: 59 | Admitting: Rheumatology

## 2017-01-14 ENCOUNTER — Ambulatory Visit (INDEPENDENT_AMBULATORY_CARE_PROVIDER_SITE_OTHER): Payer: 59

## 2017-01-14 VITALS — BP 132/80 | HR 80 | Resp 16 | Ht 61.0 in | Wt 190.0 lb

## 2017-01-14 DIAGNOSIS — M25561 Pain in right knee: Secondary | ICD-10-CM

## 2017-01-14 DIAGNOSIS — M79642 Pain in left hand: Secondary | ICD-10-CM

## 2017-01-14 DIAGNOSIS — Z8639 Personal history of other endocrine, nutritional and metabolic disease: Secondary | ICD-10-CM | POA: Diagnosis not present

## 2017-01-14 DIAGNOSIS — M25562 Pain in left knee: Secondary | ICD-10-CM

## 2017-01-14 DIAGNOSIS — E785 Hyperlipidemia, unspecified: Secondary | ICD-10-CM | POA: Diagnosis not present

## 2017-01-14 DIAGNOSIS — M2550 Pain in unspecified joint: Secondary | ICD-10-CM | POA: Diagnosis not present

## 2017-01-14 DIAGNOSIS — G8929 Other chronic pain: Secondary | ICD-10-CM

## 2017-01-14 DIAGNOSIS — M79641 Pain in right hand: Secondary | ICD-10-CM

## 2017-01-14 DIAGNOSIS — Z8679 Personal history of other diseases of the circulatory system: Secondary | ICD-10-CM

## 2017-01-14 DIAGNOSIS — R768 Other specified abnormal immunological findings in serum: Secondary | ICD-10-CM

## 2017-01-14 DIAGNOSIS — Z8619 Personal history of other infectious and parasitic diseases: Secondary | ICD-10-CM | POA: Diagnosis not present

## 2017-01-14 NOTE — Patient Instructions (Signed)
Supplements for OA Natural anti-inflammatories  You can purchase these at [employer], [employer] or online.  . Turmeric (capsules)  . Ginger (ginger root or capsules)  . Omega 3 (Fish, flax seeds, chia seeds, walnuts, almonds)  . Tart cherry (dried or extract)   Patient should be under the care of a physician while taking these supplements. This may not be reproduced without the permission of Dr. Marjorie Deprey.  

## 2017-01-15 LAB — ANGIOTENSIN CONVERTING ENZYME: ANGIOTENSIN-CONVERTING ENZYME: 44 U/L (ref 9–67)

## 2017-01-15 LAB — CYCLIC CITRUL PEPTIDE ANTIBODY, IGG: Cyclic Citrullin Peptide Ab: 23 Units — ABNORMAL HIGH

## 2017-01-15 LAB — CP5000020 ENA PANEL
ENA SM Ab Ser-aCnc: <1
Ribonucleic Protein(ENA) Antibody, IgG: <1
SSA (Ro) (ENA) Antibody, IgG: <1
SSB (La) (ENA) Antibody, IgG: <1
Scleroderma (Scl-70) (ENA) Antibody, IgG: <1

## 2017-01-15 LAB — CARDIOLIPIN ANTIBODIES, IGG, IGM, IGA
Anticardiolipin IgG: <14 [GPL'U]
Anticardiolipin IgM: <12 [MPL'U]

## 2017-01-15 LAB — C3 AND C4
C3 COMPLEMENT: 143 mg/dL (ref 83–193)
C4 COMPLEMENT: 39 mg/dL (ref 15–57)

## 2017-01-15 LAB — RHEUMATOID FACTOR: Rheumatoid fact SerPl-aCnc: <14 IU/mL (ref <14)

## 2017-01-15 LAB — CK: CK TOTAL: 168 U/L — AB (ref 29–143)

## 2017-01-16 LAB — LUPUS ANTICOAGULANT PANEL

## 2017-01-16 LAB — RFX DRVVT SCR W/RFLX CONF 1:1 MIX: dRVVT Screen: 39 s (ref <=45)

## 2017-01-16 LAB — RFX PTT-LA W/RFX TO HEX PHASE CONF: PTT-LA Screen: 36 s (ref <=40)

## 2017-01-17 LAB — BETA-2 GLYCOPROTEIN ANTIBODIES
Beta-2 Glyco I IgG: <9 SGU (ref <=20)
Beta-2-Glycoprotein I IgM: <9 SMU (ref <=20)

## 2017-01-18 ENCOUNTER — Telehealth: Payer: Self-pay | Admitting: Rheumatology

## 2017-01-18 NOTE — Telephone Encounter (Signed)
Patient advises her labs would be discussed at her new patient follow up visit.

## 2017-01-18 NOTE — Telephone Encounter (Signed)
Patient would like to know lab results. Requesting a call back.

## 2017-01-18 NOTE — Progress Notes (Signed)
Will discuss at follow up visit

## 2017-02-13 DIAGNOSIS — M19041 Primary osteoarthritis, right hand: Secondary | ICD-10-CM | POA: Insufficient documentation

## 2017-02-13 DIAGNOSIS — M170 Bilateral primary osteoarthritis of knee: Secondary | ICD-10-CM | POA: Insufficient documentation

## 2017-02-13 DIAGNOSIS — M19042 Primary osteoarthritis, left hand: Secondary | ICD-10-CM

## 2017-02-13 NOTE — Progress Notes (Deleted)
   Office Visit Note  Patient: Allison Colon             Date of Birth: Nov 17, 1960           MRN: [id number]             PCP: Hayden Rasmussen, MD Referring: No ref. 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   History of Present Illness: Allison Colon is a 56 y.o. female ***   Activities of Daily Living:  Patient reports morning stiffness for *** {minute/hour:19697}.   Patient {ACTIONS;DENIES/REPORTS:21021675::"Denies"} nocturnal pain.  Difficulty dressing/grooming: {ACTIONS;DENIES/REPORTS:21021675::"Denies"} Difficulty climbing stairs: {ACTIONS;DENIES/REPORTS:21021675::"Denies"} Difficulty getting out of chair: {ACTIONS;DENIES/REPORTS:21021675::"Denies"} Difficulty using hands for taps, buttons, cutlery, and/or writing: {ACTIONS;DENIES/REPORTS:21021675::"Denies"}   No Rheumatology ROS completed.   PMFS History:  Patient Active Problem List   Diagnosis Date Noted  . History of vitamin D deficiency 01/14/2017  . ANA positive 01/04/2017  . Multiple joint pain 01/04/2017  . Essential hypertension 10/12/2014  . Hyperlipidemia 10/12/2014  . HSV-2 (herpes simplex virus 2) infection 03/24/2014    Class: History of    Past Medical History:  Diagnosis Date  . Anemia   . Anxiety   . Elevated antinuclear antibody (ANA) level 2018  . Elevated hemoglobin A1c 2018  . Family history of heart disease   . Fibroid   . Hyperlipidemia   . Hypertension   . Low vitamin D level 2018  . STD (sexually transmitted disease)    HSV    Family History  Problem Relation Age of Onset  . Diabetes Mother   . Hypertension Mother   . Hyperlipidemia Mother   . Heart disease Mother   . HIV Father   . Hypertension Father   . Heart disease Sister   . HIV Sister   . Hepatitis Brother   . Gout Brother        deceased   Past Surgical History:  Procedure Laterality Date  . broken leg Bilateral    age 29  .  PELVIC LAPAROSCOPY  12/2003   RSO   Social History   Social History Narrative  . No narrative on file     Objective: Vital Signs: There were no vitals taken for this visit.   Physical Exam   Musculoskeletal Exam: ***  CDAI Exam: No CDAI exam completed.    Investigation: No additional findings.   Imaging: No results found.  Speciality Comments: No specialty comments available. 12/22/2016 ANA 1:80 homogeneous, 01/14/2017 ENA negative, C3-C4 normal, anticardiolipin negative, beta-2 GP one negative, lupus anticoagulant negative, RF negative, anti-CCP 23 positive, CK 168 elevated, Ace 44  Procedures:  No procedures performed Allergies: Patient has no known allergies.   Assessment / Plan:     Visit Diagnoses: No diagnosis found.    Orders: No orders of the defined types were placed in this encounter.  No orders of the defined types were placed in this encounter.   Face-to-face time spent with patient was *** minutes. 50% of time was spent in counseling and coordination of care.  Follow-Up Instructions: No Follow-up on file.   Bo Merino, MD  Note - This record has been created using Editor, commissioning.  Chart creation errors have been sought, but may not always  have been located. Such creation errors do not reflect on  the standard of medical care.

## 2017-02-14 ENCOUNTER — Ambulatory Visit: Payer: 59 | Admitting: Rheumatology

## 2017-03-07 ENCOUNTER — Encounter: Payer: Self-pay | Admitting: Obstetrics and Gynecology

## 2017-03-11 ENCOUNTER — Other Ambulatory Visit: Payer: Self-pay | Admitting: Cardiovascular Disease

## 2017-03-13 ENCOUNTER — Telehealth: Payer: Self-pay | Admitting: Obstetrics and Gynecology

## 2017-03-22 NOTE — Addendum Note (Signed)
Addended by: Graylon Good on: 03/22/2017 09:23 AM   Modules accepted: Orders

## 2017-03-26 NOTE — Telephone Encounter (Signed)
Thank you for the update.  I have closed the encounter.  A reminder will come up in EPIC for me as she has a future order.

## 2017-03-26 NOTE — Telephone Encounter (Signed)
Patient canceled her appointment for vit d recheck. She states she will call back to reschedule this appointment.

## 2017-03-27 ENCOUNTER — Other Ambulatory Visit: Payer: Self-pay

## 2017-04-24 ENCOUNTER — Telehealth: Payer: Self-pay | Admitting: Obstetrics and Gynecology

## 2017-04-24 NOTE — Telephone Encounter (Signed)
Please contact patient to schedule her 3 month vit D recheck.

## 2017-04-25 NOTE — Telephone Encounter (Signed)
Spoke with patient. Lab appointment scheduled for tomorrow 04/26/2017 at 1:40 pm. Patient is agreeable to date and time.

## 2017-04-26 ENCOUNTER — Other Ambulatory Visit (INDEPENDENT_AMBULATORY_CARE_PROVIDER_SITE_OTHER): Payer: 59

## 2017-04-26 DIAGNOSIS — E559 Vitamin D deficiency, unspecified: Secondary | ICD-10-CM

## 2017-04-26 DIAGNOSIS — R7989 Other specified abnormal findings of blood chemistry: Secondary | ICD-10-CM

## 2017-04-27 LAB — VITAMIN D 25 HYDROXY (VIT D DEFICIENCY, FRACTURES): VIT D 25 HYDROXY: 23.9 ng/mL — AB (ref 30.0–100.0)

## 2017-04-27 MED ORDER — VITAMIN D (ERGOCALCIFEROL) 1.25 MG (50000 UNIT) PO CAPS: 50000.0000 [IU] | ORAL_CAPSULE | ORAL | 0 refills | Status: DC

## 2017-04-30 ENCOUNTER — Encounter: Payer: Self-pay | Admitting: Obstetrics and Gynecology

## 2017-05-01 ENCOUNTER — Telehealth: Payer: Self-pay | Admitting: *Deleted

## 2017-05-01 NOTE — Telephone Encounter (Signed)
See telephone encounter dated 05/01/17.

## 2017-05-01 NOTE — Telephone Encounter (Addendum)
Left message to call Sharee Pimple at [phone].     From Garner, MD Sent 04/30/2017 6:24 AM  Thank you i will pick upnthe prescription. Is there anything else i can do to maintain or keep it leveled??   I have a question about VITAMIN D 25 HYDROXY (VIT D DEFICIENCY, FRACTURES) resulted on 04/27/17, 3:37 AM.

## 2017-05-01 NOTE — Telephone Encounter (Signed)
Spoke with patient in regards to MyChart message as seen below. Advised patient Vitamin D comes from natural sunlight and food. Vitamin D supplement may be recommended if not getting enough from food and sunlight . Reviewed foods that are good sources of Vit D.   Advised patient to complete RX Vitamin D as prescribed, f/u for recheck with lab in 3 months. Dr. Quincy Simmonds will make further recommendations based on results.   Advised patient Dr. Quincy Simmonds is out of the office, will review with covering provider and return call with any additional recommendations. Patient verbalizes understanding and is agreeable.  Routing to provider for final review. Patient is agreeable to disposition. Will close encounter.  Cc: Dr. Quincy Simmonds

## 2017-05-06 ENCOUNTER — Telehealth: Payer: Self-pay | Admitting: Obstetrics and Gynecology

## 2017-05-06 NOTE — Telephone Encounter (Signed)
Spoke with patient. Patient requesting to restart HRT for hot flashes, mood swings, anxiety and inability to sleep.   Patient states she started estradiol patch and Prometrium in March 2017, the combination worked well. Reports stopping medication after 1.5 months after "listening to opinions of friends/family about hormones". Patient states she is aware of risk and benefits, discussed in depth at [facility]. Advised patient Dr. Quincy Simmonds os out of the office, will review with covering provider and return call with recommendations, patient is agreeable.  Last AEX 12/14/16. Last MMG 11/09/16 -Birads 1 neg  Dr. Sabra Heck -please review and advise on HRT?  Cc: Dr. Quincy Simmonds

## 2017-05-06 NOTE — Telephone Encounter (Signed)
Patient called and is requesting that Dr Quincy Simmonds put her back on a low dose of hormone

## 2017-05-06 NOTE — Telephone Encounter (Signed)
Spoke with patient, advised as seen below per Dr. Sabra Heck. Patient states she now take 81 mg Aspirin daily. Patient states years ago she had a panic attack and was started on aspirin to prevent blood clots and never stopped taking it. Patient declined OV at this time, will return call after returning to work to look at schedule then return call to schedule. Patient verbalizes understanding and is agreeable.   Routing to provider for final review. Patient is agreeable to disposition. Will close encounter.

## 2017-05-06 NOTE — Telephone Encounter (Signed)
I think given the amount of time since she was on on this, I think Dr. Quincy Simmonds would like to see her.  Also, can you clarify why she is taking the 325mg  dosage of aspirin.

## 2017-06-06 ENCOUNTER — Telehealth: Payer: Self-pay | Admitting: Cardiovascular Disease

## 2017-06-06 NOTE — Telephone Encounter (Signed)
Patient called and notified that the orders have been placed. She wants to go to [facility]. Orders are currently placed for this location. She verbalized her understanding.

## 2017-06-06 NOTE — Telephone Encounter (Signed)
Pt wants to know if it is time for her to get a Lipid Profile? She says she think it is time to have it.

## 2017-06-11 ENCOUNTER — Telehealth: Payer: Self-pay | Admitting: Obstetrics and Gynecology

## 2017-06-11 NOTE — Telephone Encounter (Signed)
Patient would like to discuss hormone replacement therapy.

## 2017-06-11 NOTE — Telephone Encounter (Signed)
Spoke with patient. Patient requesting appointment with Dr. Quincy Simmonds on 06/17/17. Patient scheduled for OV on 06/17/17 at 3:30pm. Patient declines suicidal ideations/homicidal ideations. Patient aware if symptoms worsen can return call for earlier appointment or seek care at local ER.   Routing to provider for final review. Patient is agreeable to disposition. Will close encounter.

## 2017-06-11 NOTE — Telephone Encounter (Signed)
Left message to call Latonya Knight at [phone].  

## 2017-06-11 NOTE — Telephone Encounter (Signed)
Spoke with patient. Patient would like to discuss HRT options with Dr. Quincy Simmonds. States she has been experiencing hot flashes and increased mood changes in the last 2 weeks. Feels "down and tearful". Denies any recent changes. Recommended OV for further evaluation and discussion with Dr. Quincy Simmonds. Offered appointment for 10/17, 10/18 and 10/19. Patient states she needs to look at schedule and return call to schedule. Patient verbalizes understanding and is agreeable.

## 2017-06-13 ENCOUNTER — Telehealth: Payer: Self-pay | Admitting: Cardiovascular Disease

## 2017-06-13 ENCOUNTER — Telehealth: Payer: Self-pay | Admitting: Obstetrics and Gynecology

## 2017-06-13 DIAGNOSIS — R7309 Other abnormal glucose: Secondary | ICD-10-CM

## 2017-06-13 LAB — LIPID PANEL
Cholesterol: 150 mg/dL (ref <200)
HDL: 61 mg/dL (ref >50)
LDL CHOLESTEROL (CALC): 74 mg/dL
NON-HDL CHOLESTEROL (CALC): 89 mg/dL (ref <130)
Total CHOL/HDL Ratio: 2.5 (calc) (ref <5.0)
Triglycerides: 66 mg/dL (ref <150)

## 2017-06-13 LAB — HEPATIC FUNCTION PANEL
AG RATIO: 1.5 (calc) (ref 1.0–2.5)
ALKALINE PHOSPHATASE (APISO): 50 U/L (ref 33–130)
ALT: 11 U/L (ref 6–29)
AST: 15 U/L (ref 10–35)
Albumin: 4.3 g/dL (ref 3.6–5.1)
Bilirubin, Direct: 0 mg/dL (ref 0.0–0.2)
GLOBULIN: 2.9 g/dL (ref 1.9–3.7)
Indirect Bilirubin: 0.2 mg/dL (calc) (ref 0.2–1.2)
TOTAL PROTEIN: 7.2 g/dL (ref 6.1–8.1)
Total Bilirubin: 0.2 mg/dL (ref 0.2–1.2)

## 2017-06-13 NOTE — Telephone Encounter (Signed)
Dr.Silva, okay to add A1C level to lab work?

## 2017-06-13 NOTE — Telephone Encounter (Signed)
Spoke with patient and advised patient order needed to come Dr Quincy Simmonds who ordered in April or PCP

## 2017-06-13 NOTE — Telephone Encounter (Signed)
Patient will need to ask her cardiologist to add the hemoglobin A1C.  It is reasonable to do this because she was borderline with her last A1C check in [facility].

## 2017-06-13 NOTE — Telephone Encounter (Signed)
Reviewed with patient. Patient states Cardiology advised her this needed to be ordered by our office as we ordered it in April or her PCP office.

## 2017-06-13 NOTE — Telephone Encounter (Signed)
Ok to add it on and have this result sent to me.

## 2017-06-13 NOTE — Telephone Encounter (Signed)
New message    Pt at lab waiting  Pt would like you to send order for A1C lab work to [employer] orders through computer she is at the [facility] on [address]

## 2017-06-13 NOTE — Telephone Encounter (Signed)
Order placed and released to [facility]. Call to [facility] off [address] who states they are unable to add this order onto the labs the patient had performed today. Do not have the right tube. Patient may return to have this done. Notified patient who will return to the lab to have this performed tomorrow.  Routing to provider for final review. Patient agreeable to disposition. Will close encounter.

## 2017-06-13 NOTE — Telephone Encounter (Signed)
Patient had some blood work done at [facility] on [address]. [employer]. this morning. [facility] is holding her blood for additional orders. Patient would like an order sent for an A1C. Patient said her Cardiologist did not check her A1C and would like Dr.Silva to order this for her. No need to call patient unless there is a problem.

## 2017-06-14 LAB — HEMOGLOBIN A1C
HEMOGLOBIN A1C: 5.9 %{Hb} — AB (ref <5.7)
MEAN PLASMA GLUCOSE: 123 (calc)
eAG (mmol/L): 6.8 (calc)

## 2017-06-17 ENCOUNTER — Telehealth: Payer: Self-pay | Admitting: Obstetrics and Gynecology

## 2017-06-17 ENCOUNTER — Encounter: Payer: Self-pay | Admitting: Obstetrics and Gynecology

## 2017-06-17 ENCOUNTER — Ambulatory Visit: Payer: Self-pay | Admitting: Obstetrics and Gynecology

## 2017-06-17 NOTE — Telephone Encounter (Signed)
Patient cancelled problem visit appointment this afternoon. No explanation given and she is aware of fee.

## 2017-06-17 NOTE — Telephone Encounter (Signed)
Thank you for the update!

## 2017-06-25 ENCOUNTER — Encounter: Payer: Self-pay | Admitting: Obstetrics and Gynecology

## 2017-06-25 ENCOUNTER — Ambulatory Visit (INDEPENDENT_AMBULATORY_CARE_PROVIDER_SITE_OTHER): Payer: 59 | Admitting: Obstetrics and Gynecology

## 2017-06-25 ENCOUNTER — Telehealth: Payer: Self-pay | Admitting: Obstetrics and Gynecology

## 2017-06-25 ENCOUNTER — Other Ambulatory Visit: Payer: Self-pay | Admitting: Obstetrics and Gynecology

## 2017-06-25 VITALS — BP 108/70 | HR 64 | Resp 14 | Wt 189.0 lb

## 2017-06-25 DIAGNOSIS — N951 Menopausal and female climacteric states: Secondary | ICD-10-CM | POA: Diagnosis not present

## 2017-06-25 DIAGNOSIS — R4586 Emotional lability: Secondary | ICD-10-CM

## 2017-06-25 MED ORDER — VENLAFAXINE HCL ER 37.5 MG PO CP24: 37.5000 mg | ORAL_CAPSULE | Freq: Every day | ORAL | 1 refills | Status: DC

## 2017-06-25 NOTE — Telephone Encounter (Signed)
Patient would like an appointment to discuss HRT.

## 2017-06-25 NOTE — Progress Notes (Signed)
GYNECOLOGY  VISIT   HPI: 56 y.o.   Married  [place] American  female   [phone] with Patient's last menstrual period was 06/11/2017.   here for   Emotional changes; patient would like to discuss HRT.  Menses this year:  Jan 12 , April 2, May 22, June 13, July 3, August 1, Sept 28.   Estradiol 24 and FSH 60 on 12/14/16.  She is having brain fog in the morning. Goes away by 10:00 am.  Emotional lability. Waking up tired.  Not sleeping well. Feeling depressed.  Denies suicidal ideation.   Having headaches and attributes it to menopause.  Has elevated hemoglobin A1C 5.9 on 06/13/17.  GYNECOLOGIC HISTORY: Patient's last menstrual period was 06/11/2017. Contraception:  none Menopausal hormone therapy:  none Last mammogram:  11-09-16 Density B/Neg/BiRads1:TBC Last pap smear:   11-09-15 Neg:Neg HR HPV; 05-25-13 Neg:Neg HR HPV        OB History    Gravida Para Term Preterm AB Living   10 1 1   9 1    SAB TAB Ectopic Multiple Live Births   1 8     1          Patient Active Problem List   Diagnosis Date Noted  . Primary osteoarthritis of both hands 02/13/2017  . Primary osteoarthritis of both knees 02/13/2017  . History of vitamin D deficiency 01/14/2017  . ANA positive 01/04/2017  . Multiple joint pain 01/04/2017  . Essential hypertension 10/12/2014  . Hyperlipidemia 10/12/2014  . HSV-2 (herpes simplex virus 2) infection 03/24/2014    Class: History of    Past Medical History:  Diagnosis Date  . Anemia   . Anxiety   . Elevated antinuclear antibody (ANA) level 2018  . Elevated hemoglobin A1c 2018  . Family history of heart disease   . Fibroid   . Hyperlipidemia   . Hypertension   . Low vitamin D level 2018  . STD (sexually transmitted disease)    HSV    Past Surgical History:  Procedure Laterality Date  . broken leg Bilateral    age 69  . PELVIC LAPAROSCOPY  12/2003   RSO    Current Outpatient Prescriptions  Medication Sig Dispense Refill  . aspirin EC 325 MG  tablet Take 325 mg by mouth at bedtime.    . enalapril (VASOTEC) 10 MG tablet Take 1 tablet (10 mg total) by mouth 2 (two) times daily. 30 tablet 11  . rosuvastatin (CRESTOR) 10 MG tablet Take 1 tablet (10 mg total) by mouth daily. 30 tablet 11  . valACYclovir (VALTREX) 500 MG tablet TAKE 1 TABLET BY MOUTH DAILY. INCREASE TO 1 TABLET TWICE DAILY AT OUTBREAK FOR 3 DAYS 30 tablet 2   No current facility-administered medications for this visit.      ALLERGIES: Patient has no known allergies.  Family History  Problem Relation Age of Onset  . Diabetes Mother   . Hypertension Mother   . Hyperlipidemia Mother   . Heart disease Mother   . HIV Father   . Hypertension Father   . Heart disease Sister   . HIV Sister   . Hepatitis Brother   . Gout Brother        deceased    Social History   Social History  . Marital status: Married    Spouse name: N/A  . Number of children: N/A  . Years of education: N/A   Occupational History  . Not on file.   Social History Main Topics  .  Smoking status: Never Smoker  . Smokeless tobacco: Never Used  . Alcohol use No  . Drug use: No  . Sexual activity: Yes    Partners: Male    Birth control/ protection: None   Other Topics Concern  . Not on file   Social History Narrative  . No narrative on file    ROS:  Pertinent items are noted in HPI.  PHYSICAL EXAMINATION:    BP 108/70 (BP Location: Right Arm, Patient Position: Sitting, Cuff Size: Large)   Pulse 64   Resp 14   Wt 189 lb (85.7 kg)   LMP 06/11/2017   BMI 35.71 kg/m     General appearance: alert, cooperative and appears stated age   ASSESSMENT  Perimenopausal female.  Menopausal symptoms.  Emotional lability.  PLAN  We discussed perimenopause and menopause.  We discussed Micronor risks and benefits versus SSRI/SNRI.  She prefers not to do hormonal tx at this time.  Will start Effexor 37.5 mg daily.  I reviewed potential side effects - difficulty sleeping, decreased  libido, serotonin syndrome. Follow up in 6 weeks.    An After Visit Summary was printed and given to the patient.  __25____ minutes face to face time of which over 50% was spent in counseling.

## 2017-06-25 NOTE — Telephone Encounter (Signed)
Spoke with patient. Patient states she is feeling different. Is feeling more irritable and sad. States some days it is hard for her to function and perform normal daily activities. Feels this is related to menopause. "I feel like my body is different even in my bones." Requesting to be seen as soon as possible. Appointment scheduled for today 06/25/2017 at 3:45 pm with Dr.Silva. Patient is agreeable to date and time.  Routing to provider for final review. Patient agreeable to disposition. Will close encounter.

## 2017-06-25 NOTE — Patient Instructions (Signed)
Venlafaxine extended-release capsules  What is this medicine?  VENLAFAXINE(VEN la fax een) is used to treat depression, anxiety and panic disorder.  This medicine may be used for other purposes; ask your health care provider or pharmacist if you have questions.  COMMON BRAND NAME(S): Effexor XR  What should I tell my health care provider before I take this medicine?  They need to know if you have any of these conditions:  -bleeding disorders  -glaucoma  -heart disease  -high blood pressure  -high cholesterol  -kidney disease  -liver disease  -low levels of sodium in the blood  -mania or bipolar disorder  -seizures  -suicidal thoughts, plans, or attempt; a previous suicide attempt by you or a family  -take medicines that treat or prevent blood clots  -thyroid disease  -an unusual or allergic reaction to venlafaxine, desvenlafaxine, other medicines, foods, dyes, or preservatives  -pregnant or trying to get pregnant  -breast-feeding  How should I use this medicine?  Take this medicine by mouth with a full glass of water. Follow the directions on the prescription label. Do not cut, crush, or chew this medicine. Take it with food. If needed, the capsule may be carefully opened and the entire contents sprinkled on a spoonful of cool applesauce. Swallow the applesauce/pellet mixture right away without chewing and follow with a glass of water to ensure complete swallowing of the pellets. Try to take your medicine at about the same time each day. Do not take your medicine more often than directed. Do not stop taking this medicine suddenly except upon the advice of your doctor. Stopping this medicine too quickly may cause serious side effects or your condition may worsen.  A special MedGuide will be given to you by the pharmacist with each prescription and refill. Be sure to read this information carefully each time.  Talk to your pediatrician regarding the use of this medicine in children. Special care may be  needed.  Overdosage: If you think you have taken too much of this medicine contact a poison control center or emergency room at once.  NOTE: This medicine is only for you. Do not share this medicine with others.  What if I miss a dose?  If you miss a dose, take it as soon as you can. If it is almost time for your next dose, take only that dose. Do not take double or extra doses.  What may interact with this medicine?  Do not take this medicine with any of the following medications:  -certain medicines for fungal infections like fluconazole, itraconazole, ketoconazole, posaconazole, voriconazole  -cisapride  -desvenlafaxine  -dofetilide  -dronedarone  -duloxetine  -levomilnacipran  -linezolid  -MAOIs like Carbex, Eldepryl, Marplan, Nardil, and Parnate  -methylene blue (injected into a vein)  -milnacipran  -pimozide  -thioridazine  -ziprasidone  This medicine may also interact with the following medications:  -amphetamines  -aspirin and aspirin-like medicines  -certain medicines for depression, anxiety, or psychotic disturbances  -certain medicines for migraine headaches like almotriptan, eletriptan, frovatriptan, naratriptan, rizatriptan, sumatriptan, zolmitriptan  -certain medicines for sleep  -certain medicines that treat or prevent blood clots like dalteparin, enoxaparin, warfarin  -cimetidine  -clozapine  -diuretics  -fentanyl  -furazolidone  -indinavir  -isoniazid  -lithium  -metoprolol  -NSAIDS, medicines for pain and inflammation, like ibuprofen or naproxen  -other medicines that prolong the QT interval (cause an abnormal heart rhythm)  -procarbazine  -rasagiline  -supplements like St. John's wort, kava kava, valerian  -tramadol  -tryptophan    worse. Visit your doctor or health care professional for regular checks on your progress. Because it may take several weeks to see the full effects of this medicine, it is important to continue your treatment as prescribed by your doctor. Patients and their families should watch out for new or worsening thoughts of suicide or depression. Also watch out for sudden changes in feelings such as feeling anxious, agitated, panicky, irritable, hostile, aggressive, impulsive, severely restless, overly excited and hyperactive, or not being able to sleep. If this happens, especially at the beginning of treatment or after a change in dose, call your health care professional. This medicine can cause an increase in blood pressure. Check with your doctor for instructions on monitoring your blood pressure while taking this medicine. You may get drowsy or dizzy. Do not drive, use machinery, or do anything that needs mental alertness until you know how this medicine affects you. Do not stand or sit up quickly, especially if you are an older patient. This reduces the risk of dizzy or fainting spells. Alcohol may interfere with the effect of this medicine. Avoid alcoholic drinks. Your mouth may get dry. Chewing sugarless gum, sucking hard candy and drinking plenty of water will help. Contact your doctor if the problem does not go away or is severe. What side effects may I notice from receiving this medicine? Side effects that you should report to your doctor or health care professional as soon as possible: -allergic reactions like skin rash, itching or hives, swelling of the face, lips, or tongue -anxious -breathing problems -confusion -changes in vision -chest pain -confusion -elevated mood, decreased need for sleep, racing thoughts, impulsive behavior -eye pain -fast, irregular  heartbeat -feeling faint or lightheaded, falls -feeling agitated, angry, or irritable -hallucination, loss of contact with reality -high blood pressure -loss of balance or coordination -palpitations -redness, blistering, peeling or loosening of the skin, including inside the mouth -restlessness, pacing, inability to keep still -seizures -stiff muscles -suicidal thoughts or other mood changes -trouble passing urine or change in the amount of urine -trouble sleeping -unusual bleeding or bruising -unusually weak or tired -vomiting Side effects that usually do not require medical attention (report to your doctor or health care professional if they continue or are bothersome): -change in sex drive or performance -change in appetite or weight -constipation -dizziness -dry mouth -headache -increased sweating -nausea -tired This list may not describe all possible side effects. Call your doctor for medical advice about side effects. You may report side effects to FDA at 1-800-FDA-1088. Where should I keep my medicine? Keep out of the reach of children. Store at a controlled temperature between 20 and 25 degrees C (68 degrees and 77 degrees F), in a dry place. Throw away any unused medicine after the expiration date. NOTE: This sheet is a summary. It may not cover all possible information. If you have questions about this medicine, talk to your doctor, pharmacist, or health care provider.  2018 Elsevier/Gold Standard (2016-01-12 18:38:02)  

## 2017-06-26 ENCOUNTER — Encounter: Payer: Self-pay | Admitting: Obstetrics and Gynecology

## 2017-06-26 ENCOUNTER — Telehealth: Payer: Self-pay | Admitting: Obstetrics and Gynecology

## 2017-06-26 DIAGNOSIS — R51 Headache: Principal | ICD-10-CM

## 2017-06-26 DIAGNOSIS — R519 Headache, unspecified: Secondary | ICD-10-CM

## 2017-06-26 NOTE — Telephone Encounter (Signed)
Allison.Silva okay to place referral?   Allison Colon  to Nunzio Cobbs, MD     9:00 AM  Hi Allison Allison Colon morning   It totally slipped my mind on yesterday. Can you please send a referral to Allison Colon for me. I need to be sure my headaches are coming from Menopause. As i stated to you on yesterday these headaches are strange to me. Its not like a tegular headache, its wierd.   Allison Antony Contras  [address]  [place] [address]  [phone]   Than

## 2017-06-26 NOTE — Telephone Encounter (Signed)
Ok to place referral for headaches to her requested provider.

## 2017-06-26 NOTE — Telephone Encounter (Signed)
See telephone encounter dated with today's date.

## 2017-06-26 NOTE — Telephone Encounter (Signed)
Referral to Dr.Sethi with [facility]. Patient has been notified and is aware she will be contacted by [facility] or our office's referral coordinator to schedule her appointment. Patient is agreeable.  Routing to provider for final review. Patient agreeable to disposition. Will close encounter.

## 2017-06-26 NOTE — Telephone Encounter (Signed)
Patient called and said she forgot to request a referral to a neurologist when she saw Dr. Quincy Simmonds yesterday. She'd like to see Dr. Antony Contras at [facility].

## 2017-07-19 ENCOUNTER — Encounter: Payer: Self-pay | Admitting: Emergency Medicine

## 2017-07-19 ENCOUNTER — Emergency Department
Admission: EM | Admit: 2017-07-19 | Discharge: 2017-07-19 | Disposition: A | Discharge status: Home / Self Care | Payer: 59 | Attending: Emergency Medicine | Admitting: Emergency Medicine

## 2017-07-19 DIAGNOSIS — G5602 Carpal tunnel syndrome, left upper limb: Secondary | ICD-10-CM | POA: Diagnosis not present

## 2017-07-19 DIAGNOSIS — Z7982 Long term (current) use of aspirin: Secondary | ICD-10-CM | POA: Insufficient documentation

## 2017-07-19 DIAGNOSIS — Z79899 Other long term (current) drug therapy: Secondary | ICD-10-CM | POA: Diagnosis not present

## 2017-07-19 DIAGNOSIS — M79602 Pain in left arm: Secondary | ICD-10-CM | POA: Diagnosis present

## 2017-07-19 DIAGNOSIS — I10 Essential (primary) hypertension: Secondary | ICD-10-CM | POA: Diagnosis not present

## 2017-07-19 MED ORDER — MELOXICAM 15 MG PO TABS: 15.0000 mg | ORAL_TABLET | Freq: Every day | ORAL | 1 refills | Status: AC

## 2017-07-19 MED ORDER — KETOROLAC TROMETHAMINE 30 MG/ML IJ SOLN
30.0000 mg | Freq: Once | INTRAMUSCULAR | Status: AC
  Administered 2017-07-19: 30 mg via INTRAMUSCULAR
  Filled 2017-07-19: qty 1

## 2017-07-19 NOTE — ED Provider Notes (Signed)
[facility] Emergency Department Provider Note  ____________________________________________  Time seen: Approximately 11:16 PM  I have reviewed the triage vital signs and the nursing notes.   HISTORY  Chief Complaint Arm Pain    HPI Allison Colon is a 56 y.o. female presents to the emergency department with concern regarding left upper arm pain after bowling.  Patient reports no audible sounds associated with arm pain.  Patient has been actively using the left upper extremity.  She describes pain as an ache worsened with flexion at the elbow.  Patient typically works in [employer] and has a history of carpal tunnel.  She denies weakness, radiculopathy or changes in sensation of the upper extremities.  Patient denies chest pain, chest tightness, shortness of breath, nausea, vomiting and abdominal pain.  Patient reports that she has been increasingly anxious as she is currently going through menopause.  Patient denies suicidal or homicidal   Past Medical History:  Diagnosis Date  . Anemia   . Anxiety   . Elevated antinuclear antibody (ANA) level 2018  . Elevated hemoglobin A1c 2018  . Family history of heart disease   . Fibroid   . Hyperlipidemia   . Hypertension   . Low vitamin D level 2018  . STD (sexually transmitted disease)    HSV    Patient Active Problem List   Diagnosis Date Noted  . Primary osteoarthritis of both hands 02/13/2017  . Primary osteoarthritis of both knees 02/13/2017  . History of vitamin D deficiency 01/14/2017  . ANA positive 01/04/2017  . Multiple joint pain 01/04/2017  . Essential hypertension 10/12/2014  . Hyperlipidemia 10/12/2014  . HSV-2 (herpes simplex virus 2) infection 03/24/2014    Class: History of    Past Surgical History:  Procedure Laterality Date  . broken leg Bilateral    age 4  . PELVIC LAPAROSCOPY  12/2003   RSO    Prior to Admission medications   Medication Sig Start Date End Date Taking?  Authorizing Provider  aspirin EC 325 MG tablet Take 325 mg by mouth at bedtime.    [provider]  enalapril (VASOTEC) 10 MG tablet Take 1 tablet (10 mg total) by mouth 2 (two) times daily. 10/26/16   Lorretta Harp, MD  meloxicam (MOBIC) 15 MG tablet Take 1 tablet (15 mg total) by mouth daily for 7 days. 07/19/17 07/26/17  Lannie Fields, PA-C  rosuvastatin (CRESTOR) 10 MG tablet Take 1 tablet (10 mg total) by mouth daily. 10/26/16   Lorretta Harp, MD  valACYclovir (VALTREX) 500 MG tablet TAKE 1 TABLET BY MOUTH DAILY. INCREASE TO 1 TABLET TWICE DAILY AT OUTBREAK FOR 3 DAYS 12/14/16   Yisroel Ramming, Everardo All, MD  venlafaxine XR (EFFEXOR XR) 37.5 MG 24 hr capsule Take 1 capsule (37.5 mg total) by mouth daily. 06/25/17   Nunzio Cobbs, MD    Allergies Patient has no known allergies.  Family History  Problem Relation Age of Onset  . Diabetes Mother   . Hypertension Mother   . Hyperlipidemia Mother   . Heart disease Mother   . HIV Father   . Hypertension Father   . Heart disease Sister   . HIV Sister   . Hepatitis Brother   . Gout Brother        deceased    Social History Social History   Tobacco Use  . Smoking status: Never Smoker  . Smokeless tobacco: Never Used  Substance Use Topics  .  Alcohol use: No    Alcohol/week: 0.0 oz  . Drug use: No     Review of Systems  Constitutional: No fever/chills Eyes: No visual changes. No discharge ENT: No upper respiratory complaints. Cardiovascular: no chest pain. Respiratory: no cough. No SOB. Musculoskeletal: Patient has left arm pain.  Skin: Negative for rash, abrasions, lacerations, ecchymosis. Neurological: Negative for headaches, focal weakness or numbness.  ____________________________________________   PHYSICAL EXAM:  VITAL SIGNS: ED Triage Vitals  Enc Vitals Group     BP 07/19/17 2234 134/79     Pulse Rate 07/19/17 2234 77     Resp 07/19/17 2234 16     Temp 07/19/17 2234 97.7 F (36.5  C)     Temp Source 07/19/17 2234 Oral     SpO2 07/19/17 2234 98 %     Weight 07/19/17 2234 178 lb (80.7 kg)     Height 07/19/17 2234 5\' 1"  (1.549 m)     Head Circumference --      Peak Flow --      Pain Score 07/19/17 2238 8     Pain Loc --      Pain Edu? --      Excl. in [place]? --      Constitutional: Alert and oriented. Well appearing and in no acute distress. Eyes: Conjunctivae are normal. PERRL. EOMI. Head: Atraumatic. Cardiovascular: Normal rate, regular rhythm. Normal S1 and S2.  Good peripheral circulation. Respiratory: Normal respiratory effort without tachypnea or retractions. Lungs CTAB. Good air entry to the bases with no decreased or absent breath sounds. Musculoskeletal: Full range of motion to all extremities. No gross deformities appreciated. Neurologic:  Normal speech and language. No gross focal neurologic deficits are appreciated.  Skin:  Skin is warm, dry and intact. No rash noted. Psychiatric: Mood and affect are normal. Speech and behavior are normal. Patient exhibits appropriate insight and judgement.   ____________________________________________   LABS (all labs ordered are listed, but only abnormal results are displayed)  Labs Reviewed - No data to display ____________________________________________  EKG   ____________________________________________  RADIOLOGY  No results found.  ____________________________________________    PROCEDURES  Procedure(s) performed:    Procedures    Medications  ketorolac (TORADOL) 30 MG/ML injection 30 mg (not administered)     ____________________________________________   INITIAL IMPRESSION / ASSESSMENT AND PLAN / ED COURSE  Pertinent labs & imaging results that were available during my care of the patient were reviewed by me and considered in my medical decision making (see chart for details).  Review of the [place] CSRS was performed in accordance of the [place] prior to dispensing any controlled  drugs.     Assessment and plan Upper arm pain Carpal Tunnel Patient presents to the emergency department with aching left upper arm pain after bowling.  Physical exam was reassuring and revealed no palpable deficit at the insertion of the biceps tendon or laxity with collateral ligament testing at the elbow.  Patient reported secondary symptoms associated with carpal tunnel.  Patient was given an injection of Toradol in the emergency department.  She was also discharged with a Velcro wrist splint for carpal tunnel.  Patient education was provided regarding the course of menopause.  Supplemental vitamins, daily exercise and hydration were encouraged.  Vital signs are reassuring prior to discharge.  All patient questions were answered     ____________________________________________  FINAL CLINICAL IMPRESSION(S) / ED DIAGNOSES  Final diagnoses:  Pain of left upper extremity  Carpal tunnel syndrome of left wrist  NEW MEDICATIONS STARTED DURING THIS VISIT:  ED Discharge Orders        Ordered    meloxicam (MOBIC) 15 MG tablet  Daily     07/19/17 2313          This chart was dictated using voice recognition software/Dragon. Despite best efforts to proofread, errors can occur which can change the meaning. Any change was purely unintentional.    Lannie Fields, PA-C 07/19/17 Janus Molder    Arta Silence, MD 07/20/17 [phone]

## 2017-07-19 NOTE — ED Triage Notes (Signed)
Pt states left upper arm pain that began at 2130. Pt denies shob, dizziness, nausea, chest pain, sweating. Pt states pain is only present when she moves her left arm. Pt states her left hand feels tight when she adducts or abducts arm.

## 2017-07-26 ENCOUNTER — Ambulatory Visit: Payer: 59 | Admitting: Neurology

## 2017-07-28 ENCOUNTER — Emergency Department
Admission: EM | Admit: 2017-07-28 | Discharge: 2017-07-28 | Disposition: A | Discharge status: Home / Self Care | Payer: 59 | Attending: Emergency Medicine | Admitting: Emergency Medicine

## 2017-07-28 ENCOUNTER — Encounter: Payer: Self-pay | Admitting: Cardiovascular Disease

## 2017-07-28 DIAGNOSIS — Z79899 Other long term (current) drug therapy: Secondary | ICD-10-CM | POA: Diagnosis not present

## 2017-07-28 DIAGNOSIS — I10 Essential (primary) hypertension: Secondary | ICD-10-CM | POA: Insufficient documentation

## 2017-07-28 DIAGNOSIS — Z7982 Long term (current) use of aspirin: Secondary | ICD-10-CM | POA: Insufficient documentation

## 2017-07-28 DIAGNOSIS — N951 Menopausal and female climacteric states: Secondary | ICD-10-CM

## 2017-07-28 DIAGNOSIS — R42 Dizziness and giddiness: Secondary | ICD-10-CM | POA: Diagnosis not present

## 2017-07-28 LAB — CBC
HEMATOCRIT: 43.7 % (ref 35.0–47.0)
HEMOGLOBIN: 14.5 g/dL (ref 12.0–16.0)
MCH: 28.5 pg (ref 26.0–34.0)
MCHC: 33.1 g/dL (ref 32.0–36.0)
MCV: 86.2 fL (ref 80.0–100.0)
Platelets: 293 10*3/uL (ref 150–440)
RBC: 5.07 MIL/uL (ref 3.80–5.20)
RDW: 15.2 % — ABNORMAL HIGH (ref 11.5–14.5)
WBC: 8.9 10*3/uL (ref 3.6–11.0)

## 2017-07-28 LAB — BASIC METABOLIC PANEL
ANION GAP: 9 (ref 5–15)
BUN: 13 mg/dL (ref 6–20)
CALCIUM: 9.5 mg/dL (ref 8.9–10.3)
CO2: 25 mmol/L (ref 22–32)
Chloride: 105 mmol/L (ref 101–111)
Creatinine, Ser: 0.9 mg/dL (ref 0.44–1.00)
GLUCOSE: 99 mg/dL (ref 65–99)
POTASSIUM: 4.6 mmol/L (ref 3.5–5.1)
Sodium: 139 mmol/L (ref 135–145)

## 2017-07-28 LAB — PREGNANCY, URINE: Preg Test, Ur: NEGATIVE

## 2017-07-28 LAB — URINALYSIS, COMPLETE (UACMP) WITH MICROSCOPIC
BILIRUBIN URINE: NEGATIVE
Bacteria, UA: NONE SEEN
GLUCOSE, UA: NEGATIVE mg/dL
HGB URINE DIPSTICK: NEGATIVE
Ketones, ur: NEGATIVE mg/dL
LEUKOCYTES UA: NEGATIVE
NITRITE: NEGATIVE
PROTEIN: NEGATIVE mg/dL
RBC / HPF: NONE SEEN RBC/hpf (ref 0–5)
Specific Gravity, Urine: 1.008 (ref 1.005–1.030)
pH: 7 (ref 5.0–8.0)

## 2017-07-28 LAB — TROPONIN I

## 2017-07-28 NOTE — ED Triage Notes (Signed)
Pt presents via POV c/o dizziness and dry mouth this am. Denies chest pain or SOB.

## 2017-07-28 NOTE — ED Notes (Signed)
First Nurse Note: Pt ambulatory into ED c/o feeling lightheaded and having a dry mouth. Pt denies chest pain or numbness/weakness on one side of the body. Pt in NAD at this time.

## 2017-07-28 NOTE — ED Provider Notes (Signed)
[facility] Emergency Department Provider Note  ____________________________________________   First MD Initiated Contact with Patient 07/28/17 1310     (approximate)  I have reviewed the triage vital signs and the nursing notes.   HISTORY  Chief Complaint Dizziness   HPI Allison Colon is a 56 y.o. female presents for evaluation of feeling lightheaded  Patient reports she got up this morning, she and her husband are on the way to biscuit Allison Colon as she was hungry.  While sitting waiting for her food, she started feeling lightheaded, felt slightly fatigued, and felt "fuzzy".  No trouble speaking, did not feels that she is in a pass out, no racing heart, no chest pain or shortness of breath.  Felt her mouth got slightly dry reports he started feeling anxious.  She reports she is been having these types of symptoms off and on for several months now, but she became concerned because she felt lightheaded and wanted to come be checked out.  She reports she saw her doctor recently and they diagnosed her as being in perimenopause and they been treating the similar symptoms to that.  While in the ED waiting room she began to start having her period which she reports is light red and spotting, now that she knows that she feels like her symptoms are likely all related to start of her irregular periods for which her last one was about 2 weeks ago, then about a month before that.  No ongoing symptoms at this time.  She is requesting to be discharged reports he feels much better was ready to go home  Past Medical History:  Diagnosis Date  . Anemia   . Anxiety   . Elevated antinuclear antibody (ANA) level 2018  . Elevated hemoglobin A1c 2018  . Family history of heart disease   . Fibroid   . Hyperlipidemia   . Hypertension   . Low vitamin D level 2018  . STD (sexually transmitted disease)    HSV    Patient Active Problem List   Diagnosis Date Noted  . Primary  osteoarthritis of both hands 02/13/2017  . Primary osteoarthritis of both knees 02/13/2017  . History of vitamin D deficiency 01/14/2017  . ANA positive 01/04/2017  . Multiple joint pain 01/04/2017  . Essential hypertension 10/12/2014  . Hyperlipidemia 10/12/2014  . HSV-2 (herpes simplex virus 2) infection 03/24/2014    Class: History of    Past Surgical History:  Procedure Laterality Date  . broken leg Bilateral    age 82  . PELVIC LAPAROSCOPY  12/2003   RSO    Prior to Admission medications   Medication Sig Start Date End Date Taking? Authorizing Provider  aspirin EC 325 MG tablet Take 325 mg by mouth at bedtime.    [provider]  enalapril (VASOTEC) 10 MG tablet Take 1 tablet (10 mg total) by mouth 2 (two) times daily. 10/26/16   Lorretta Harp, MD  rosuvastatin (CRESTOR) 10 MG tablet Take 1 tablet (10 mg total) by mouth daily. 10/26/16   Lorretta Harp, MD  valACYclovir (VALTREX) 500 MG tablet TAKE 1 TABLET BY MOUTH DAILY. INCREASE TO 1 TABLET TWICE DAILY AT OUTBREAK FOR 3 DAYS 12/14/16   Yisroel Ramming, Everardo All, MD  venlafaxine XR (EFFEXOR XR) 37.5 MG 24 hr capsule Take 1 capsule (37.5 mg total) by mouth daily. 06/25/17   Nunzio Cobbs, MD    Allergies Patient has no known allergies.  Family  History  Problem Relation Age of Onset  . Diabetes Mother   . Hypertension Mother   . Hyperlipidemia Mother   . Heart disease Mother   . HIV Father   . Hypertension Father   . Heart disease Sister   . HIV Sister   . Hepatitis Brother   . Gout Brother        deceased    Social History Social History   Tobacco Use  . Smoking status: Never Smoker  . Smokeless tobacco: Never Used  Substance Use Topics  . Alcohol use: No    Alcohol/week: 0.0 oz  . Drug use: No    Review of Systems Constitutional: No fever/chills but did feel fatigued which she describes a hard to describe lightheaded feeling Eyes: No visual changes. ENT: No sore  throat. Cardiovascular: Denies chest pain. Respiratory: Denies shortness of breath. Gastrointestinal: No abdominal pain.  No nausea, no vomiting.  No diarrhea.  No constipation. Genitourinary: Negative for dysuria.  See HPI Musculoskeletal: Negative for back pain. Skin: Negative for rash. Neurological: Negative for headaches, focal weakness or numbness.    ____________________________________________   PHYSICAL EXAM:  VITAL SIGNS: ED Triage Vitals [07/28/17 1030]  Enc Vitals Group     BP (!) 142/85     Pulse Rate 77     Resp 14     Temp 98.5 F (36.9 C)     Temp Source Oral     SpO2 100 %     Weight 188 lb (85.3 kg)     Height 5\' 2"  (1.575 m)     Head Circumference      Peak Flow      Pain Score      Pain Loc      Pain Edu?      Excl. in [place]?     Constitutional: Alert and oriented. Well appearing and in no acute distress.  Patient and her husband both very pleasant. Eyes: Conjunctivae are normal. Head: Atraumatic. Nose: No congestion/rhinnorhea. Mouth/Throat: Mucous membranes are moist. Neck: No stridor.   Cardiovascular: Normal rate, regular rhythm. Grossly normal heart sounds.  Good peripheral circulation. Respiratory: Normal respiratory effort.  No retractions. Lungs CTAB. Gastrointestinal: Soft and nontender. No distention. Musculoskeletal: No lower extremity tenderness nor edema.  Walks with normal gait.  No ataxia Neurologic:  Normal speech and language. No gross focal neurologic deficits are appreciated.  Normal cranial nerve exam.  Normal strength in all extremities.  No pronator drift.  No sensory loss or numbness on a extremity or the face. Skin:  Skin is warm, dry and intact. No rash noted. Psychiatric: Mood and affect are normal. Speech and behavior are normal.  ____________________________________________   LABS (all labs ordered are listed, but only abnormal results are displayed)  Labs Reviewed  CBC - Abnormal; Notable for the following  components:      Result Value   RDW 15.2 (*)    All other components within normal limits  URINALYSIS, COMPLETE (UACMP) WITH MICROSCOPIC - Abnormal; Notable for the following components:   Color, Urine YELLOW (*)    APPearance CLEAR (*)    Squamous Epithelial / LPF 0-5 (*)    All other components within normal limits  BASIC METABOLIC PANEL  TROPONIN I  PREGNANCY, URINE   ____________________________________________  EKG   ____________________________________________  RADIOLOGY   ____________________________________________   PROCEDURES  Procedure(s) performed: None  Procedures  Critical Care performed: No  ____________________________________________   INITIAL IMPRESSION / ASSESSMENT AND PLAN / ED  COURSE  Pertinent labs & imaging results that were available during my care of the patient were reviewed by me and considered in my medical decision making (see chart for details).  Patient presents for evaluation of episode of lightheadedness.  Notes she was in the waiting room and started having some spotting she reports that she connected all well to some of the symptoms she is been experiencing with hot flashes and around perimenopause.  Denies pregnancy.  Last menstrual cycle was about 2 weeks ago the been quite irregular for some time.  She had her thyroid checked recently with her doctor and also was diagnosed to be in perimenopause.  She appears well, no neurologic deficits or symptoms.  No cardiac or pulmonary symptoms.  Resting comfortably in no distress.  She is requesting be discharged and follow-up with her primary.  I think is very reasonable, her lab work evaluation today is very reassuring.  All symptoms seem to improve.  Return precautions and treatment recommendations and follow-up discussed with the patient who is agreeable with the plan.       ____________________________________________   FINAL CLINICAL IMPRESSION(S) / ED DIAGNOSES  Final  diagnoses:  Lightheadedness  Perimenopause      NEW MEDICATIONS STARTED DURING THIS VISIT:  This SmartLink is deprecated. Use AVSMEDLIST instead to display the medication list for a patient.   Note:  This document was prepared using Dragon voice recognition software and may include unintentional dictation errors.     Delman Kitten, MD 07/28/17 1331

## 2017-07-28 NOTE — Discharge Instructions (Signed)
° °  Return to the ED if you have a headache, sudden and severe headache, confusion, slurred speech, facial droop, weakness or numbness in any arm or leg, extreme fatigue, vision problems, or other symptoms that concern you.

## 2017-07-29 ENCOUNTER — Encounter: Payer: Self-pay | Admitting: Obstetrics and Gynecology

## 2017-07-29 ENCOUNTER — Telehealth: Payer: Self-pay | Admitting: *Deleted

## 2017-07-29 NOTE — Telephone Encounter (Signed)
My Chart Message from patient:   ----- Message from [place], Generic sent at 07/29/2017 10:12 AM EST -----    Hi Dr Quincy Simmonds    Was wondering can i try Zoloft. I dont like the other ant depressant. My Sister who has Zoloft i have tried hers and they seem to work. These panic attacks and Anxiety is a straight DEMON      THANKS

## 2017-07-29 NOTE — Telephone Encounter (Signed)
Call to patient: Patient states she has been talking to girlfriend and sister about Zoloft.  She is not sure if Effexor is best medication for her.  Patient states she does not feel like Effexor has helped anxiety and" brian fog."  Has rescheduled 6 week recheck to 09-16-17 since she has not been on medication for the full six weeks. Patient states she only stated medication approximately 07-11-17. Patient states she is aware medication takes longer to work but she is concerned this is not best medication. Offered office visit to discuss pros/cons of medication changes. Patient declines stating no office visit needed to tell MD what she told me.  Advised with her clear concerns and need for medical advice, office visit recommended. Will review with MD for instructions since declines offcie visit but advised medication changes are generally not made over the phone.  Please review. Continue medication versus office visit?

## 2017-07-29 NOTE — Telephone Encounter (Signed)
See phone encounter.Encounter closed.

## 2017-07-30 NOTE — Telephone Encounter (Signed)
I see that the patient went to the ER recently.  I think she needs to be seen for a visit at our office or she needs to follow up with her PCP.  She is really struggling with her anxiety and may need a different medical approach entirely.

## 2017-07-30 NOTE — Telephone Encounter (Signed)
Call to patient. Advised Dr Quincy Simmonds has reviewed call and agrees she may need different approach. Recommends office visit here or with PCP.  Patient states  Thank you " and disconnects call.   Encounter closed.

## 2017-07-31 ENCOUNTER — Encounter: Payer: Self-pay | Admitting: Obstetrics and Gynecology

## 2017-08-07 ENCOUNTER — Ambulatory Visit: Payer: 59 | Admitting: Obstetrics and Gynecology

## 2017-08-10 ENCOUNTER — Encounter: Payer: Self-pay | Admitting: Obstetrics and Gynecology

## 2017-08-12 ENCOUNTER — Telehealth: Payer: Self-pay | Admitting: Obstetrics and Gynecology

## 2017-08-12 NOTE — Telephone Encounter (Signed)
Call to patient. Patient states that she is interested in trying a new medication called "anxiety free" for menopausal symptoms, but would like some blood work done prior to starting the medication. Patient states she has been doing research and wants to have [facility] retested and adrenal fatigue level drawn. RN advised would need to review with covering provider as Dr. Quincy Simmonds is out of the office. Advised would review and return call with any additional recommendations. Patient agreeable.   Routing to covering provider for review.

## 2017-08-12 NOTE — Telephone Encounter (Signed)
Patient sent the following messages through MyChart:  ----- Message from [place], Generic sent at 08/10/2017 6:51 PM EST -----    Dr Quincy Simmonds    Can i also take an Adrenal fatigue lab. Im doing research and want to know where my levels are regarding TSH, adrenals n so forth. Is this possible   And   Non-Urgent Medical Question 08/10/2017 6:43 PM Reply   To: [facility] CLINICAL POOL    From: Allison Colon    Created: 08/10/2017 6:43 PM     *-*-*This message has not been handled.*-*-*  Dr Quincy Simmonds  Read thus label tell me what you think.Im just researching natural menopause and anxiety avenues        Routing message to triage nurse. Sent message back to patient informing her a nurse will be contacting her to answer her questions.

## 2017-08-13 ENCOUNTER — Encounter: Payer: Self-pay | Admitting: Obstetrics and Gynecology

## 2017-08-13 NOTE — Telephone Encounter (Signed)
She has already had an [place] done in 4/18 that was 60 (post menopausal range), the year prior it was 52.9 (also post menopausal range). Given her irregular cycles in the last year, she is still considered peri-menopausal, so her [place] could go up and down. We can check another level, but I'm not sure how it will help. I don't have any experience with  "anxiety free", but have looked it up. It is a supplement with vitamins and herbs.  Is the Effexor that Dr Quincy Simmonds prescribed helping? Adrenal testing should be done with her primary MD.

## 2017-08-13 NOTE — Telephone Encounter (Signed)
Message left to return call to Allison Colon at [phone].    

## 2017-08-14 NOTE — Telephone Encounter (Signed)
Patient sent the following additional message through MyChart:  ----- Message from [place], Generic sent at 08/13/2017 11:23 PM EST -----    Dr Quincy Simmonds    I was thinking. Could me having anxiety, feeling confused, headaches, and disoriented come from my genital herpes. Didn't dawn on me before but im having some feelings of annoyance in my left leg. Little painful but not enough for pain meds. Meningitis maybe? I dont know just trying to rule out things   Sent patient a message back through MyChart to expect a return call from the nurse.

## 2017-08-14 NOTE — Telephone Encounter (Signed)
Please call the patient, relay the information to her from yesterdays phone note.  I don't think herpes is causing her symptoms. I do think she should f/u with her primary for further evaluation of her symptoms.

## 2017-08-14 NOTE — Telephone Encounter (Signed)
Left message to call [place] at [phone].  MyChart message sent as well.

## 2017-08-14 NOTE — Telephone Encounter (Signed)
Routing to [place] for review and advise. Should patient come in for office visit to discuss concerns? Seek evaluation with PCP?

## 2017-08-16 NOTE — Telephone Encounter (Signed)
Message left to return call to Triage Nurse at [phone].    

## 2017-08-23 NOTE — Telephone Encounter (Signed)
Attempted to reach patient via telephone x 2 and MyChart x 1 with no return call or message. Please advise.

## 2017-08-27 NOTE — Telephone Encounter (Signed)
Will close the encounter  

## 2017-08-29 ENCOUNTER — Encounter: Payer: Self-pay | Admitting: Cardiovascular Disease

## 2017-08-29 ENCOUNTER — Telehealth: Payer: Self-pay | Admitting: Cardiovascular Disease

## 2017-08-29 ENCOUNTER — Encounter: Payer: Self-pay | Admitting: Cardiology

## 2017-08-29 ENCOUNTER — Other Ambulatory Visit: Payer: Self-pay

## 2017-08-29 ENCOUNTER — Emergency Department
Admission: EM | Admit: 2017-08-29 | Discharge: 2017-08-29 | Disposition: A | Discharge status: Home / Self Care | Payer: 59 | Attending: Emergency Medicine | Admitting: Emergency Medicine

## 2017-08-29 ENCOUNTER — Emergency Department: Payer: 59

## 2017-08-29 DIAGNOSIS — R002 Palpitations: Secondary | ICD-10-CM | POA: Insufficient documentation

## 2017-08-29 DIAGNOSIS — Z5321 Procedure and treatment not carried out due to patient leaving prior to being seen by health care provider: Secondary | ICD-10-CM | POA: Insufficient documentation

## 2017-08-29 LAB — CBC
HCT: 41.2 % (ref 35.0–47.0)
Hemoglobin: 13.4 g/dL (ref 12.0–16.0)
MCH: 28.2 pg (ref 26.0–34.0)
MCHC: 32.6 g/dL (ref 32.0–36.0)
MCV: 86.4 fL (ref 80.0–100.0)
PLATELETS: 279 10*3/uL (ref 150–440)
RBC: 4.77 MIL/uL (ref 3.80–5.20)
RDW: 15 % — ABNORMAL HIGH (ref 11.5–14.5)
WBC: 11.4 10*3/uL — ABNORMAL HIGH (ref 3.6–11.0)

## 2017-08-29 LAB — BASIC METABOLIC PANEL
Anion gap: 8 (ref 5–15)
BUN: 13 mg/dL (ref 6–20)
CHLORIDE: 105 mmol/L (ref 101–111)
CO2: 25 mmol/L (ref 22–32)
CREATININE: 0.81 mg/dL (ref 0.44–1.00)
Calcium: 9.5 mg/dL (ref 8.9–10.3)
GFR calc non Af Amer: >60 mL/min (ref >60)
Glucose, Bld: 102 mg/dL — ABNORMAL HIGH (ref 65–99)
Potassium: 4 mmol/L (ref 3.5–5.1)
Sodium: 138 mmol/L (ref 135–145)

## 2017-08-29 LAB — TROPONIN I: Troponin I: <0.03 ng/mL (ref <0.03)

## 2017-08-29 NOTE — Telephone Encounter (Signed)
New message    Patient c/o Palpitations:  High priority if patient c/o lightheadedness, shortness of breath, or chest pain  1) How long have you had palpitations/irregular HR/ Afib? Are you having the symptoms now? Started about 1 hour, after drinking coffee   2) Are you currently experiencing lightheadedness, SOB or CP? NO  3) Do you have a history of afib (atrial fibrillation) or irregular heart rhythm? NO  4) Have you checked your BP or HR? (document readings if available): NO  5) Are you experiencing any other symptoms? NO

## 2017-08-29 NOTE — Telephone Encounter (Signed)
Pt answered but expressed that she was in a meeting currently and could not take my call. She states she will call back.

## 2017-08-29 NOTE — Telephone Encounter (Signed)
Agree 

## 2017-08-29 NOTE — Telephone Encounter (Signed)
Follow up    Patient calling back. States still having palpitations. Patient scheduled with Kerin Ransom for 08/30/17.

## 2017-08-29 NOTE — ED Triage Notes (Signed)
Pt states at work today she felt like she has been having heart palpitations since 1pm. States she was walking up stairs when it started. Did drink coffee this AM. States it has stopped and restarted today. Pt denies CP, SOB, N&V&, diaphoresis. Alert, oriented, no distress noted. Speaking in complete sentences. States has heart palpitations before but just for a second and then goes away.

## 2017-08-29 NOTE — Telephone Encounter (Signed)
Pt of Dr. Gwendolyn Fill w patient. She reports she's been having intermittent palpitations all afternoon since about 1pm. She states hx of anxiety, and that she drank 1 cup coffee this morning, but this is not unusual. She explains that she takes an over the counter medication for anxiety (Did not recall name) but that she'd taken for several days, and then missed a couple of doses and restarted it this morning.  Pt endorses "anxiety-type feeling" at present. She denies CP, SOB, fatigue, nausea, dizziness, lightheadedness, or arm pain. She does not have HR or BP readings today. States she has cuff at home and will obtain when she returns after work. I advised that she could attempt also to count her pulse manually over 1 minute and gave her instruction on how to do this - pt states she preferred to get reading by her cuff.  I have advised her to report to ED if she is having new symptoms such as chest pain, shortness of breath, etc, or if she notes her HR is consistently above <120. Advised her also she may make use of after hours pager service if other questions.  Recommended she keep appt for tomorrow as scheduled. She will see Lurena Joiner at 2:30pm. I've asked her to bring her medication list including bottles of OTC meds w her. Routed for [employer] advice.

## 2017-08-29 NOTE — ED Notes (Signed)
Pt ambulatory to STAT without difficulty or distress noted; pt updated on wait time and delay; vs retaken

## 2017-08-29 NOTE — ED Notes (Signed)
Pt called from her phone to st that she is leaving to take her two young sons home, and is unable to wait any longer; pt informed to call her PCP in the morning for a f/u and to return here for any new or worsening symptoms; pt voices good understanding

## 2017-08-30 ENCOUNTER — Telehealth: Payer: Self-pay | Admitting: Cardiovascular Disease

## 2017-08-30 ENCOUNTER — Ambulatory Visit: Payer: Self-pay | Admitting: Cardiology

## 2017-08-30 ENCOUNTER — Ambulatory Visit: Payer: 59 | Admitting: Cardiology

## 2017-08-30 ENCOUNTER — Other Ambulatory Visit: Payer: Self-pay | Admitting: Obstetrics and Gynecology

## 2017-08-30 NOTE — Telephone Encounter (Signed)
Patient calling, wanted to cancel appt with [facility] today @2 :30 and wanted to wait for Dr. Gwenlyn Found. Patient states that she went to ED for heart fluttering and had blood work completed. She would like to know based on results if it is medically necessary for her to keep appt in March. Patient states that she is still having heart flutters.

## 2017-08-30 NOTE — Telephone Encounter (Signed)
Patient went to the ED. Encounter will be closed.

## 2017-08-30 NOTE — Telephone Encounter (Signed)
Returned the call to the patient. She stated that she went to the ED yesterday for heart flutters. According to her chart, labs and x-ray were all normal. The patient stated that she does have a history of anxiety and wonders if that could be contributing to it.  The patient had an appointment today with a PA but decided to cancel that and wait for an appointment with Dr. Gwenlyn Found. An appointment has been made for 1/15 at 8 am. The patient has been instructed to call back if she needs anything further or to go back to the ED if she develops worsening fluttering, chest pain or shortness of breath. She has verbalized her understanding.

## 2017-08-31 ENCOUNTER — Other Ambulatory Visit: Payer: Self-pay

## 2017-08-31 ENCOUNTER — Emergency Department
Admission: EM | Admit: 2017-08-31 | Discharge: 2017-08-31 | Disposition: A | Discharge status: Home / Self Care | Payer: 59 | Attending: Emergency Medicine | Admitting: Emergency Medicine

## 2017-08-31 DIAGNOSIS — I10 Essential (primary) hypertension: Secondary | ICD-10-CM | POA: Insufficient documentation

## 2017-08-31 DIAGNOSIS — R002 Palpitations: Secondary | ICD-10-CM | POA: Insufficient documentation

## 2017-08-31 DIAGNOSIS — Z7982 Long term (current) use of aspirin: Secondary | ICD-10-CM | POA: Insufficient documentation

## 2017-08-31 DIAGNOSIS — I491 Atrial premature depolarization: Secondary | ICD-10-CM | POA: Insufficient documentation

## 2017-08-31 DIAGNOSIS — Z79899 Other long term (current) drug therapy: Secondary | ICD-10-CM | POA: Insufficient documentation

## 2017-08-31 LAB — TSH: TSH: 1.511 u[IU]/mL (ref 0.350–4.500)

## 2017-08-31 LAB — CBC WITH DIFFERENTIAL/PLATELET
Basophils Absolute: 0.1 10*3/uL (ref 0–0.1)
Basophils Relative: 1 %
EOS PCT: 2 %
Eosinophils Absolute: 0.2 10*3/uL (ref 0–0.7)
HEMATOCRIT: 43.4 % (ref 35.0–47.0)
Hemoglobin: 14.5 g/dL (ref 12.0–16.0)
LYMPHS ABS: 1.8 10*3/uL (ref 1.0–3.6)
LYMPHS PCT: 20 %
MCH: 28.8 pg (ref 26.0–34.0)
MCHC: 33.5 g/dL (ref 32.0–36.0)
MCV: 86.1 fL (ref 80.0–100.0)
Monocytes Absolute: 0.7 10*3/uL (ref 0.2–0.9)
Monocytes Relative: 7 %
NEUTROS ABS: 6.4 10*3/uL (ref 1.4–6.5)
Neutrophils Relative %: 70 %
PLATELETS: 290 10*3/uL (ref 150–440)
RBC: 5.04 MIL/uL (ref 3.80–5.20)
RDW: 14.8 % — ABNORMAL HIGH (ref 11.5–14.5)
WBC: 9 10*3/uL (ref 3.6–11.0)

## 2017-08-31 LAB — BASIC METABOLIC PANEL
Anion gap: 8 (ref 5–15)
BUN: 15 mg/dL (ref 6–20)
CHLORIDE: 107 mmol/L (ref 101–111)
CO2: 23 mmol/L (ref 22–32)
Calcium: 9.3 mg/dL (ref 8.9–10.3)
Creatinine, Ser: 0.67 mg/dL (ref 0.44–1.00)
GFR calc Af Amer: >60 mL/min (ref >60)
GLUCOSE: 98 mg/dL (ref 65–99)
POTASSIUM: 4.6 mmol/L (ref 3.5–5.1)
Sodium: 138 mmol/L (ref 135–145)

## 2017-08-31 LAB — MAGNESIUM: Magnesium: 2 mg/dL (ref 1.7–2.4)

## 2017-08-31 MED ORDER — METOPROLOL TARTRATE 25 MG PO TABS: 25.0000 mg | ORAL_TABLET | Freq: Two times a day (BID) | ORAL | 0 refills | Status: DC

## 2017-08-31 NOTE — ED Notes (Signed)
Spoke with Dr Mable Paris, no orders other than EKG's.

## 2017-08-31 NOTE — ED Notes (Signed)
RN in room, Pt concerned because of EKG tracing on monitor. RN reassured patient that RN is watching monitor at nurses station and that what she is seeing on monitor is artifact.

## 2017-08-31 NOTE — ED Triage Notes (Signed)
Patient reports having heart fluttering.  Patient states was seen on Thursday for same and it hasn't stopped.

## 2017-08-31 NOTE — ED Provider Notes (Signed)
[facility] Emergency Department Provider Note  ____________________________________________   First MD Initiated Contact with Patient 08/31/17 [phone]     (approximate)  I have reviewed the triage vital signs and the nursing notes.   HISTORY  Chief Complaint Palpitations   HPI Allison Colon is a 57 y.o. female with a history of anxiety as well as hypertension was presenting to the emergency department today with palpitations since this past Tuesday.  She says that she is feeling her heart beat irregularly and is denying any chest pain, shortness of breath, nausea or vomiting.  She says that when she is up and about and distracted she does not feel palpitations but when she is concentrating on her heartbeat she is not aware of them.  She denies any drinking or drug use.  Says that she is perimenopausal and thinks that her symptoms may be related to this.  Says that she was previously having intermittent periods but now is having period every month.   Past Medical History:  Diagnosis Date  . Anemia   . Anxiety   . Elevated antinuclear antibody (ANA) level 2018  . Elevated hemoglobin A1c 2018  . Family history of heart disease   . Fibroid   . Hyperlipidemia   . Hypertension   . Low vitamin D level 2018  . STD (sexually transmitted disease)    HSV    Patient Active Problem List   Diagnosis Date Noted  . Primary osteoarthritis of both hands 02/13/2017  . Primary osteoarthritis of both knees 02/13/2017  . History of vitamin D deficiency 01/14/2017  . ANA positive 01/04/2017  . Multiple joint pain 01/04/2017  . Essential hypertension 10/12/2014  . Hyperlipidemia 10/12/2014  . HSV-2 (herpes simplex virus 2) infection 03/24/2014    Class: History of    Past Surgical History:  Procedure Laterality Date  . broken leg Bilateral    age 57  . OVARIAN CYST SURGERY    . PELVIC LAPAROSCOPY  12/2003   RSO    Prior to Admission medications     Medication Sig Start Date End Date Taking? Authorizing Provider  aspirin EC 325 MG tablet Take 325 mg by mouth at bedtime.    [provider]  enalapril (VASOTEC) 10 MG tablet Take 1 tablet (10 mg total) by mouth 2 (two) times daily. 10/26/16   Lorretta Harp, MD  rosuvastatin (CRESTOR) 10 MG tablet Take 1 tablet (10 mg total) by mouth daily. 10/26/16   Lorretta Harp, MD  valACYclovir (VALTREX) 500 MG tablet TAKE 1 TABLET BY MOUTH DAILY. INCREASE TO 1 TABLET TWICE DAILY AT OUTBREAK FOR 3 DAYS 12/14/16   Yisroel Ramming, Everardo All, MD  venlafaxine XR (EFFEXOR XR) 37.5 MG 24 hr capsule Take 1 capsule (37.5 mg total) by mouth daily. 06/25/17   Nunzio Cobbs, MD    Allergies Patient has no known allergies.  Family History  Problem Relation Age of Onset  . Diabetes Mother   . Hypertension Mother   . Hyperlipidemia Mother   . Heart disease Mother   . HIV Father   . Hypertension Father   . Heart disease Sister   . HIV Sister   . Hepatitis Brother   . Gout Brother        deceased    Social History Social History   Tobacco Use  . Smoking status: Never Smoker  . Smokeless tobacco: Never Used  Substance Use Topics  . Alcohol use:  No    Alcohol/week: 0.0 oz  . Drug use: No    Review of Systems  Constitutional: No fever/chills Eyes: No visual changes. ENT: No sore throat. Cardiovascular: As above Respiratory: Denies shortness of breath. Gastrointestinal: No abdominal pain.  No nausea, no vomiting.  No diarrhea.  No constipation. Genitourinary: Negative for dysuria. Musculoskeletal: Negative for back pain. Skin: Negative for rash. Neurological: Negative for headaches, focal weakness or numbness.   ____________________________________________   PHYSICAL EXAM:  VITAL SIGNS: ED Triage Vitals [08/31/17 0524]  Enc Vitals Group     BP (!) 130/93     Pulse Rate 65     Resp 20     Temp 98.3 F (36.8 C)     Temp Source Oral     SpO2 100 %     Weight       Height      Head Circumference      Peak Flow      Pain Score      Pain Loc      Pain Edu?      Excl. in [place]?     Constitutional: Alert and oriented. Well appearing and in no acute distress. Eyes: Conjunctivae are normal.  Head: Atraumatic. Nose: No congestion/rhinnorhea. Mouth/Throat: Mucous membranes are moist.  Neck: No stridor.   Cardiovascular: Normal rate, regular rhythm with PACs on the monitor. Grossly normal heart sounds.   Respiratory: Normal respiratory effort.  No retractions. Lungs CTAB. Gastrointestinal: Soft and nontender. No distention.  Musculoskeletal: No lower extremity tenderness nor edema.  No joint effusions. Neurologic:  Normal speech and language. No gross focal neurologic deficits are appreciated. Skin:  Skin is warm, dry and intact. No rash noted. Psychiatric: Mood and affect are normal. Speech and behavior are normal.  ____________________________________________   LABS (all labs ordered are listed, but only abnormal results are displayed)  Labs Reviewed  CBC WITH DIFFERENTIAL/PLATELET - Abnormal; Notable for the following components:      Result Value   RDW 14.8 (*)    All other components within normal limits  BASIC METABOLIC PANEL  MAGNESIUM  TSH   ____________________________________________  EKG  ED ECG REPORT I, Doran Stabler, the attending physician, personally viewed and interpreted this ECG.   Date: 08/31/2017  EKG Time: 0526  Rate: 73  Rhythm: normal sinus rhythm with PACs  Axis: Normal  Intervals:none  ST&T Change: No ST segment elevation or depression.  No abnormal T wave inversion.  ____________________________________________  RADIOLOGY   ____________________________________________   PROCEDURES  Procedure(s) performed:   Procedures  Critical Care performed:   ____________________________________________   INITIAL IMPRESSION / ASSESSMENT AND PLAN / ED COURSE  Pertinent labs & imaging results  that were available during my care of the patient were reviewed by me and considered in my medical decision making (see chart for details).  DDX: Palpitations, PACs, atrial fibrillation, atrial flutter, arrhythmia, myocardial infarction As part of my medical decision making, I reviewed the following data within the electronic MEDICAL RECORD NUMBER Notes from prior ED visits  ----------------------------------------- 9:41 AM on 08/31/2017 -----------------------------------------  Patient at this time says that she is still feeling palpitations.  Very reassuring lab work.  I offered her metoprolol twice daily and she is willing to try this for relief of her palpitations.  She says that she has an appointment with her cardiologist this 15 January.  She will discussed the palpitations with the cardiologist at this time.  She says that she will also be  needing a primary care doctor.  I will give her the reference information for the [facility] clinic that she may use or may choose the doctor through her insurance company.  She is understanding of the diagnosis as well as the treatment plan and willing to comply.  Unclear cause of the palpitations.  May be perimenopausal versus precursor to atrial fibrillation.  Patient denies any drinking or drug use.  Denies excessive caffeine intake.  Does not appear to be an MI as there is no chest pain and there are no ischemic changes on EKG.     ____________________________________________   FINAL CLINICAL IMPRESSION(S) / ED DIAGNOSES  Palpitations.  Premature atrial complexes.    NEW MEDICATIONS STARTED DURING THIS VISIT:  This SmartLink is deprecated. Use AVSMEDLIST instead to display the medication list for a patient.   Note:  This document was prepared using Dragon voice recognition software and may include unintentional dictation errors.     Orbie Pyo, MD 08/31/17 [phone]

## 2017-09-04 ENCOUNTER — Telehealth: Payer: Self-pay | Admitting: Obstetrics and Gynecology

## 2017-09-04 ENCOUNTER — Encounter: Payer: Self-pay | Admitting: Obstetrics and Gynecology

## 2017-09-04 ENCOUNTER — Ambulatory Visit (INDEPENDENT_AMBULATORY_CARE_PROVIDER_SITE_OTHER): Payer: 59 | Admitting: Neurology

## 2017-09-04 ENCOUNTER — Encounter: Payer: Self-pay | Admitting: Neurology

## 2017-09-04 VITALS — BP 107/65 | HR 56 | Ht 63.0 in | Wt 190.0 lb

## 2017-09-04 DIAGNOSIS — M792 Neuralgia and neuritis, unspecified: Secondary | ICD-10-CM

## 2017-09-04 DIAGNOSIS — M791 Myalgia, unspecified site: Secondary | ICD-10-CM | POA: Diagnosis not present

## 2017-09-04 MED ORDER — CYCLOBENZAPRINE HCL 10 MG PO TABS: 10.0000 mg | ORAL_TABLET | Freq: Every day | ORAL | 11 refills | Status: DC

## 2017-09-04 NOTE — Patient Instructions (Addendum)
  Physical Therapy: myofascial pain, muscle tightness, neuralgia: Please evaluate and treat including dry needling, stretching, strengthening, manual therapy/massage, heating, TENS unit, exercising, stretching and rhomboid as clinically warranted as well as any other modality as recommended by evaluation. Also acupuncture please.

## 2017-09-04 NOTE — Telephone Encounter (Signed)
Patient sent the following message through [employer]. Patient is requesting a referral. Routing to triage to assist patient.  ----- Message from [employer], Generic sent at 09/04/2017 5:38 AM EST -----    Dr Quincy Simmonds    Please send a copy of my records to Dr Delrae Rend [address] [facility]. I want to see an endocrinologist    Thank you

## 2017-09-04 NOTE — Telephone Encounter (Signed)
-----   Message from [place], Generic sent at 09/04/2017 1:31 PM EST -----    i need a referral    ----- Message -----  From: Nurse Naaman Plummer  Sent: 09/04/17, 8:18 AM  To: [name]  Subject: Endocrinology    Allison Colon,    I received your MyChart message this morning regarding evaluation with an endocrinologist Dr.Kerr. Do you have an appointment already set up with Dr.Kerr or are you needing a referral? I want to ensure we are doing everything needed to assist you with this appointment request.     Allison Chew, RN

## 2017-09-04 NOTE — Telephone Encounter (Signed)
Dr.Jertson,   Patient is requesting a referral to see endocrinology Dr.Kerr. Requesting referral. Okay to place?

## 2017-09-04 NOTE — Telephone Encounter (Signed)
Subject Delivery         Endocrinology 09/04/2017 8:18 AM    To: [name]    From: Gwendlyn Deutscher, RN    Created: 09/04/2017 8:18 AM     Suanne Marker,  I received your MyChart message this morning regarding evaluation with an endocrinologist Dr.Kerr. Do you have an appointment already set up with Dr.Kerr or are you needing a referral? I want to ensure we are doing everything needed to assist you with this appointment request.   Reesa Chew, RN

## 2017-09-04 NOTE — Progress Notes (Signed)
[facility]    Provider:  Dr Jaynee Eagles Referring Provider: Hayden Rasmussen, MD Primary Care Physician:  Hayden Rasmussen, MD  CC:  Leg pain  HPI:  Allison Colon is a 57 y.o. female here as a referral from Dr. Darron Doom for Migraines. However patient said that she is not here for headaches she is here for left leg nerve pain and doesnt want to discuss headaches. PMHx HTN, anxiety, elevated hemoglobin A1c, hyperlipidemia, osteoarthritis, ANA positive.  She has anxiety, tiredness, muscle pain. She Feels a tenderness on the back of the right leg, no pain. No low back pain. When she is sitting for 2-3 hours straight and she goes to stand up feels like her butt is left in the chair. No weakness. When she acts up for her herpes she gets the same thing. In the leg she can touch it lightly and feel it, she denies pain. When she wakes in the morning her hand is numb. No weakness in the left hand. She has muscle aches in the lower legs and stiffness. Flexeril helps her muscles and helps her sleep.  No other focal neurologic deficits, associated symptoms, inciting events or modifiable factors. She does not exercise at all or stretch. Lots of anxiety.    Reviewed notes, labs and imaging from outside physicians, which showed:   Tsh, Mag, BMP, CBC unremarkable  Reviewed notes, patient was seen earlier this month in the emergency room for anxiety and palpitations.  She was feeling her heart beat irregularly.  Only feels it when concentrating on her heartbeat.  Denied drinking or drug use.  She was evaluated by cardiology.  Workup was unremarkable, premature atrial complexes.  She was seen other times in the ED as well in December for feeling fuzzy, fatigue, lightheaded and dizzy.  Anxious.  In November of this year she went to the emergency room for arm pain.  She was evaluated by cardiology who thought anxiety was a factor.    Review of Systems: Patient complains of symptoms per HPI  as well as the following symptoms: numbness, insomnia, headaches, fatigue. Pertinent negatives and positives per HPI. All others negative.   Social History   Socioeconomic History  . Marital status: Married    Spouse name: Not on file  . Number of children: Not on file  . Years of education: Not on file  . Highest education level: Not on file  Social Needs  . Financial resource strain: Not on file  . Food insecurity - worry: Not on file  . Food insecurity - inability: Not on file  . Transportation needs - medical: Not on file  . Transportation needs - non-medical: Not on file  Occupational History  . Not on file  Tobacco Use  . Smoking status: Never Smoker  . Smokeless tobacco: Never Used  Substance and Sexual Activity  . Alcohol use: No    Alcohol/week: 0.0 oz  . Drug use: No  . Sexual activity: Yes    Partners: Male    Birth control/protection: None  Other Topics Concern  . Not on file  Social History Narrative  . Not on file    Family History  Problem Relation Age of Onset  . Diabetes Mother   . Hypertension Mother   . Hyperlipidemia Mother   . Heart disease Mother   . HIV Father   . Hypertension Father   . Heart disease Sister   . HIV Sister   . Hepatitis Brother   .  Gout Brother        deceased  . Neuromuscular disorder Neg Hx     Past Medical History:  Diagnosis Date  . Anemia   . Anxiety   . Elevated antinuclear antibody (ANA) level 2018  . Elevated hemoglobin A1c 2018  . Family history of heart disease   . Fibroid   . Hyperlipidemia   . Hypertension   . Low vitamin D level 2018  . STD (sexually transmitted disease)    HSV    Past Surgical History:  Procedure Laterality Date  . broken leg Bilateral    age 63  . OVARIAN CYST SURGERY    . PELVIC LAPAROSCOPY  12/2003   RSO    Current Outpatient Medications  Medication Sig Dispense Refill  . aspirin EC 325 MG tablet Take 325 mg by mouth at bedtime.    . enalapril (VASOTEC) 10 MG tablet  Take by mouth.    . metoprolol tartrate (LOPRESSOR) 25 MG tablet Take 1 tablet (25 mg total) by mouth 2 (two) times daily. (Patient taking differently: Take 25 mg by mouth 2 (two) times daily. ) 30 tablet 0  . Multiple Vitamins-Minerals (MULTIVITAMIN WITH MINERALS) tablet Take by mouth.    . rosuvastatin (CRESTOR) 10 MG tablet Take 1 tablet (10 mg total) by mouth daily. 30 tablet 11  . valACYclovir (VALTREX) 500 MG tablet TAKE 1 TABLET BY MOUTH DAILY. INCREASE TO 1 TABLET TWICE DAILY AT OUTBREAK FOR 3 DAYS 30 tablet 2  . cyclobenzaprine (FLEXERIL) 10 MG tablet Take 1 tablet (10 mg total) by mouth at bedtime. 30 tablet 11   No current facility-administered medications for this visit.     Allergies as of 09/04/2017  . (No Known Allergies)    Vitals: Pulse (!) 56   Ht 5\' 3"  (1.6 m)   Wt 190 lb (86.2 kg)   LMP 08/28/2017   BMI 33.66 kg/m  Last Weight:  Wt Readings from Last 1 Encounters:  09/04/17 190 lb (86.2 kg)   Last Height:   Ht Readings from Last 1 Encounters:  09/04/17 5\' 3"  (1.6 m)   Physical exam: Exam: Gen: NAD, conversant, well nourised, obese, well groomed                     CV: RRR, no MRG. No Carotid Bruits. No peripheral edema, warm, nontender Eyes: Conjunctivae clear without exudates or hemorrhage  Neuro: Detailed Neurologic Exam  Speech:    Speech is normal; fluent and spontaneous with normal comprehension.  Cognition:    The patient is oriented to person, place, and time;     recent and remote memory intact;     language fluent;     normal attention, concentration,     fund of knowledge Cranial Nerves:    The pupils are equal, round, and reactive to light. The fundi are normal and spontaneous venous pulsations are present. Visual fields are full to finger confrontation. Extraocular movements are intact. Trigeminal sensation is intact and the muscles of mastication are normal. The face is symmetric. The palate elevates in the midline. Hearing intact. Voice  is normal. Shoulder shrug is normal. The tongue has normal motion without fasciculations.   Coordination:    Normal finger to nose and heel to shin. Normal rapid alternating movements.   Gait:    Heel-toe and tandem gait are normal.   Motor Observation:    No asymmetry, no atrophy, and no involuntary movements noted. Tone:    Normal  muscle tone.    Posture:    Posture is normal. normal erect    Strength:    Strength is V/V in the upper and lower limbs.      Sensation: intact to LT     Reflex Exam:  DTR's:    Deep tendon reflexes in the upper and lower extremities are normal bilaterally.   Toes:    The toes are downgoing bilaterally.   Clonus:    Clonus is absent.   Cc: Hayden Rasmussen, MD    Assessment/Plan:  Patient here for leg posterior thigh "sensitivity" when she sits for too long.  Exam is normal. Denies LBP or radicular pain. Denies pain just occasional sensitivity.  Integrative Therapy: Physical Therapy: myofascial pain, muscle tightness, neuralgia: Please evaluate and treat including dry needling, stretching, strengthening, manual therapy/massage, heating, TENS unit, exercising, stretching and rhomboid as clinically warranted as well as any other modality as recommended by evaluation. Also acupuncture please.   If symptoms persist after PT or worsen then we can initiate emg/ncs or imaging   Sarina Ill, MD  [facility] [address] [place], [place] [address]  Phone [phone] Fax [phone]

## 2017-09-04 NOTE — Telephone Encounter (Signed)
I can't figure out why she needs a referral to Endocrinology? Can you please ask her.

## 2017-09-05 ENCOUNTER — Other Ambulatory Visit: Payer: Self-pay | Admitting: Obstetrics and Gynecology

## 2017-09-05 ENCOUNTER — Telehealth: Payer: Self-pay | Admitting: Neurology

## 2017-09-05 ENCOUNTER — Encounter: Payer: Self-pay | Admitting: Obstetrics and Gynecology

## 2017-09-05 DIAGNOSIS — A6004 Herpesviral vulvovaginitis: Secondary | ICD-10-CM

## 2017-09-05 NOTE — Telephone Encounter (Signed)
Sent Referral via Fax

## 2017-09-05 NOTE — Telephone Encounter (Signed)
-----   Message from [place], Generic sent at 09/05/2017 2:49 PM EST -----    yes i want to be thoroughly checked out   Left detailed message to call [place] at [phone]. Advised patient need further information about why she would like to be seen. Need to know why she seeks endocrinology evaluation. What symptoms is she having, concerns, etc. Needs to provide more information for referral.

## 2017-09-05 NOTE — Telephone Encounter (Signed)
To: [name]    From: Gwendlyn Deutscher, RN    Created: 09/05/2017 8:13 AM     Suanne Marker,   In order to place the referral Dr.Jerston would like to better understand why you would like to seek evaluation with endocrinology. Is it for lab testing? If so what kind? Are you having any symptoms that are concerning for endocrine evaluation? Any information you can provide will be helpful.  Reesa Chew, RN

## 2017-09-06 NOTE — Telephone Encounter (Signed)
After review with Dr.Miller. MyChart message below sent to patient.   To: Allison Colon    From: Gwendlyn Deutscher, RN    Created: 09/06/2017 9:01 AM     Allison Colon,  Please contact the office to discuss this in detail. We are unable to place a referral to Endocrinology without an abnormal lab testing or finding. We do want to assist you with getting proper care with a provider who can evaluate your symptoms. Please call to office to discuss this.  Reesa Chew, RN

## 2017-09-06 NOTE — Telephone Encounter (Signed)
Medication refill request: Valtrex Last AEX:  12/14/16 BS Next AEX: 09/16/17 Last MMG (if hormonal medication request): 11/09/16 BIRADS 1 negative/density b Refill authorized: 12/14/16 #30 w/2 refills; today please advise

## 2017-09-06 NOTE — Telephone Encounter (Signed)
-----   Message from [place], Generic sent at 09/05/2017 6:07 PM EST -----    Dr. Quincy Simmonds    I need my valacyclovir the pharmacy is saying my prescription has no more refills it's expired

## 2017-09-06 NOTE — Telephone Encounter (Signed)
-----   Message from [place], Generic sent at 09/06/2017 12:01 AM EST -----    Allison Colon    I want my hormones and blood checked by a specialist. This Anxiety is thru the roof. I woke up out if my sleep just now with my heart racing. This is getting ugly    I have attempted to reach this patient to discuss via telephone with no return call. Patient only responds via [place]. Noted that patient was seen in ED on 08/31/2017 with a history of anxiety as well as hypertension was presenting to the emergency department with palpitations. Patient is now reporting increased anxiety that is keeping her awake. Please advise.  "Patient at this time says that she is still feeling palpitations.  Very reassuring lab work.  I offered her metoprolol twice daily and she is willing to try this for relief of her palpitations.  She says that she has an appointment with her cardiologist this 15 January.  She will discussed the palpitations with the cardiologist at this time.  She says that she will also be needing a primary care doctor.  I will give her the reference information for the [facility] clinic that she may use or may choose the doctor through her insurance company.  She is understanding of the diagnosis as well as the treatment plan and willing to comply.  Unclear cause of the palpitations.  May be perimenopausal versus precursor to atrial fibrillation.  Patient denies any drinking or drug use.  Denies excessive caffeine intake.  Does not appear to be an MI as there is no chest pain and there are no ischemic changes on EKG."

## 2017-09-06 NOTE — Telephone Encounter (Signed)
Refill completed.

## 2017-09-10 ENCOUNTER — Telehealth: Payer: Self-pay

## 2017-09-10 ENCOUNTER — Ambulatory Visit: Payer: Self-pay | Admitting: Cardiovascular Disease

## 2017-09-10 NOTE — Telephone Encounter (Signed)
Spoke wth patient. Patient is returning call and MyChart message from 09/04/2017. Patient states that she is having problem with anxiety that she feels are hormonally related. Reports her menses has been sporadic and ever since she has been having ongoing issues with anxiety that is worsening. Menses will not come for 7 months then will have a menses then miss 3 months then have a menses, etc. Feels this is related to her symptoms. Wants lab testing. Has an appointment with Dr.Silva on 09/16/2017. Asking to have lab testing before appointment to review this with Dr.Silva on 09/16/2017 and make a plan. Advised will review with covering MD for lab recommendations and return call.

## 2017-09-11 NOTE — Progress Notes (Deleted)
GYNECOLOGY  VISIT   HPI: 57 y.o.   Married  [place] American  female   [phone] with Patient's last menstrual period was 08/28/2017.   here for     GYNECOLOGIC HISTORY: Patient's last menstrual period was 08/28/2017. Contraception:  *** Menopausal hormone therapy:  *** Last mammogram:  11-09-16 Density B/Neg/BiRads1:TBC Last pap smear:   11-09-15 Neg:Neg HR HPV; 05-25-13 Neg:Neg HR HPV        OB History    Gravida Para Term Preterm AB Living   10 1 1   9 1    SAB TAB Ectopic Multiple Live Births   1 8     1          Patient Active Problem List   Diagnosis Date Noted  . Primary osteoarthritis of both hands 02/13/2017  . Primary osteoarthritis of both knees 02/13/2017  . History of vitamin D deficiency 01/14/2017  . ANA positive 01/04/2017  . Multiple joint pain 01/04/2017  . Essential hypertension 10/12/2014  . Hyperlipidemia 10/12/2014  . HSV-2 (herpes simplex virus 2) infection 03/24/2014    Class: History of    Past Medical History:  Diagnosis Date  . Anemia   . Anxiety   . Elevated antinuclear antibody (ANA) level 2018  . Elevated hemoglobin A1c 2018  . Family history of heart disease   . Fibroid   . Hyperlipidemia   . Hypertension   . Low vitamin D level 2018  . STD (sexually transmitted disease)    HSV    Past Surgical History:  Procedure Laterality Date  . broken leg Bilateral    age 34  . OVARIAN CYST SURGERY    . PELVIC LAPAROSCOPY  12/2003   RSO    Current Outpatient Medications  Medication Sig Dispense Refill  . aspirin EC 325 MG tablet Take 325 mg by mouth at bedtime.    . cyclobenzaprine (FLEXERIL) 10 MG tablet Take 1 tablet (10 mg total) by mouth at bedtime. 30 tablet 11  . enalapril (VASOTEC) 10 MG tablet Take by mouth.    . metoprolol tartrate (LOPRESSOR) 25 MG tablet Take 1 tablet (25 mg total) by mouth 2 (two) times daily. (Patient taking differently: Take 25 mg by mouth 2 (two) times daily. ) 30 tablet 0  . Multiple Vitamins-Minerals  (MULTIVITAMIN WITH MINERALS) tablet Take by mouth.    . rosuvastatin (CRESTOR) 10 MG tablet Take 1 tablet (10 mg total) by mouth daily. 30 tablet 11  . valACYclovir (VALTREX) 500 MG tablet TAKE 1 TABLET BY MOUTH DAILY. INCREASE TO 1 TABLET TWICE DAILY AT OUTBREAK FOR 3 DAYS 30 tablet 2  . valACYclovir (VALTREX) 500 MG tablet TAKE 1 TABLET BY MOUTH DAILY. INCREASE TO 1 TABLET TWICE DAILY AT OUTBREAK FOR 3 DAYS 30 tablet 3   No current facility-administered medications for this visit.      ALLERGIES: Patient has no known allergies.  Family History  Problem Relation Age of Onset  . Diabetes Mother   . Hypertension Mother   . Hyperlipidemia Mother   . Heart disease Mother   . HIV Father   . Hypertension Father   . Heart disease Sister   . HIV Sister   . Hepatitis Brother   . Gout Brother        deceased  . Neuromuscular disorder Neg Hx     Social History   Socioeconomic History  . Marital status: Married    Spouse name: Not on file  . Number of children: Not on file  .  Years of education: Not on file  . Highest education level: Not on file  Social Needs  . Financial resource strain: Not on file  . Food insecurity - worry: Not on file  . Food insecurity - inability: Not on file  . Transportation needs - medical: Not on file  . Transportation needs - non-medical: Not on file  Occupational History  . Not on file  Tobacco Use  . Smoking status: Never Smoker  . Smokeless tobacco: Never Used  Substance and Sexual Activity  . Alcohol use: No    Alcohol/week: 0.0 oz  . Drug use: No  . Sexual activity: Yes    Partners: Male    Birth control/protection: None  Other Topics Concern  . Not on file  Social History Narrative  . Not on file    ROS:  Pertinent items are noted in HPI.  PHYSICAL EXAMINATION:    LMP 08/28/2017     General appearance: alert, cooperative and appears stated age Head: Normocephalic, without obvious abnormality, atraumatic Neck: no adenopathy,  supple, symmetrical, trachea midline and thyroid normal to inspection and palpation Lungs: clear to auscultation bilaterally Breasts: normal appearance, no masses or tenderness, No nipple retraction or dimpling, No nipple discharge or bleeding, No axillary or supraclavicular adenopathy Heart: regular rate and rhythm Abdomen: soft, non-tender, no masses,  no organomegaly Extremities: extremities normal, atraumatic, no cyanosis or edema Skin: Skin color, texture, turgor normal. No rashes or lesions Lymph nodes: Cervical, supraclavicular, and axillary nodes normal. No abnormal inguinal nodes palpated Neurologic: Grossly normal  Pelvic: External genitalia:  no lesions              Urethra:  normal appearing urethra with no masses, tenderness or lesions              Bartholins and Skenes: normal                 Vagina: normal appearing vagina with normal color and discharge, no lesions              Cervix: no lesions                Bimanual Exam:  Uterus:  normal size, contour, position, consistency, mobility, non-tender              Adnexa: no mass, fullness, tenderness              Rectal exam: {yes no:[id number]}.  Confirms.              Anus:  normal sphincter tone, no lesions  Chaperone was present for exam.  ASSESSMENT     PLAN     An After Visit Summary was printed and given to the patient.  ______ minutes face to face time of which over 50% was spent in counseling.

## 2017-09-11 NOTE — Telephone Encounter (Signed)
The patient recently had a normal TSH. She can have an Bee, but I'm not sure if Dr Quincy Simmonds will recommend other lab work after seeing and examining the patient.

## 2017-09-12 NOTE — Telephone Encounter (Signed)
Spoke with patient. Patient states that she would like to wait to see Dr.Silva in case additional lab work is needed. Encounter closed.

## 2017-09-13 ENCOUNTER — Ambulatory Visit: Payer: Self-pay | Admitting: Cardiovascular Disease

## 2017-09-13 ENCOUNTER — Encounter: Payer: Self-pay | Admitting: *Deleted

## 2017-09-13 ENCOUNTER — Telehealth: Payer: Self-pay | Admitting: Obstetrics and Gynecology

## 2017-09-13 NOTE — Telephone Encounter (Signed)
I spoke with patient to reschedule her 6 week recheck appointment with Dr.Silva 09/16/17. I offered to reschedule or have a triage nurse call her. Patient said she did not want a return call and did not want to reschedule this appointment.

## 2017-09-13 NOTE — Telephone Encounter (Signed)
Left message to call Kaitlyn at [phone]. 

## 2017-09-16 ENCOUNTER — Ambulatory Visit: Payer: 59 | Admitting: Obstetrics and Gynecology

## 2017-09-18 ENCOUNTER — Encounter: Payer: Self-pay | Admitting: Cardiovascular Disease

## 2017-09-24 ENCOUNTER — Telehealth: Payer: Self-pay | Admitting: Obstetrics and Gynecology

## 2017-09-24 NOTE — Telephone Encounter (Signed)
Patient called and said she is transferring care to [facility]. She has an appointment on 09/26/17 at that office. Routing to provider for FYI.

## 2017-09-25 ENCOUNTER — Encounter: Payer: Self-pay | Admitting: Cardiovascular Disease

## 2017-09-27 ENCOUNTER — Ambulatory Visit: Payer: 59 | Admitting: Cardiovascular Disease

## 2017-09-27 ENCOUNTER — Encounter: Payer: Self-pay | Admitting: Cardiovascular Disease

## 2017-09-27 DIAGNOSIS — R002 Palpitations: Secondary | ICD-10-CM

## 2017-09-27 DIAGNOSIS — I10 Essential (primary) hypertension: Secondary | ICD-10-CM

## 2017-09-27 MED ORDER — ENALAPRIL MALEATE 10 MG PO TABS: 10.0000 mg | ORAL_TABLET | Freq: Every day | ORAL | 11 refills | Status: DC

## 2017-09-27 MED ORDER — ROSUVASTATIN CALCIUM 10 MG PO TABS: 10.0000 mg | ORAL_TABLET | Freq: Every day | ORAL | 11 refills | Status: DC

## 2017-09-27 NOTE — Assessment & Plan Note (Signed)
Allison Colon was recently seen in the emergency room palpitations. Her EKG showed frequent PACs. She saw her OB/GYN yesterday who confirmed that she was in menopause. The ER physician did write her a prescription for metoprolol twice a day which she has yet to fill.

## 2017-09-27 NOTE — Assessment & Plan Note (Signed)
History of hyperlipidemia on statin therapy with lipid profile performed 06/13/17 revealing a Total cholesterol 150, HDL of 61.
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
## 2017-09-27 NOTE — Patient Instructions (Signed)

## 2017-09-27 NOTE — Assessment & Plan Note (Signed)
History of essential hypertension blood pressure measurements at 129/85. She is on enalapril. Continue current meds at current dosing.

## 2017-10-02 ENCOUNTER — Other Ambulatory Visit: Payer: Self-pay | Admitting: Obstetrics and Gynecology

## 2017-10-29 ENCOUNTER — Ambulatory Visit: Payer: 59 | Admitting: Cardiovascular Disease

## 2017-11-01 ENCOUNTER — Telehealth: Payer: Self-pay | Admitting: Cardiovascular Disease

## 2017-11-01 NOTE — Telephone Encounter (Signed)
Spoke with patient and she had swelling in her feet from about Friday to Wednesday. Swelling did go down at night. Swelling as subsided. She did eat pork ribs during that time. Denies and shortness of breath or CHF in the past.  Advised to watch her salt intake and call PCP if returns.

## 2017-11-01 NOTE — Telephone Encounter (Signed)
New Message   Patient is requesting to speak with a nurse. She states that she has been experience pain in the back of her knee. Please call to discuss.

## 2017-12-13 ENCOUNTER — Other Ambulatory Visit: Payer: Self-pay

## 2017-12-13 ENCOUNTER — Encounter: Payer: Self-pay | Admitting: Emergency Medicine

## 2017-12-13 ENCOUNTER — Emergency Department
Admission: EM | Admit: 2017-12-13 | Discharge: 2017-12-13 | Disposition: A | Discharge status: Home / Self Care | Payer: 59 | Attending: Emergency Medicine | Admitting: Emergency Medicine

## 2017-12-13 ENCOUNTER — Emergency Department: Payer: 59

## 2017-12-13 DIAGNOSIS — Z79899 Other long term (current) drug therapy: Secondary | ICD-10-CM | POA: Diagnosis not present

## 2017-12-13 DIAGNOSIS — M545 Low back pain, unspecified: Secondary | ICD-10-CM

## 2017-12-13 DIAGNOSIS — Z7982 Long term (current) use of aspirin: Secondary | ICD-10-CM | POA: Insufficient documentation

## 2017-12-13 DIAGNOSIS — I10 Essential (primary) hypertension: Secondary | ICD-10-CM | POA: Insufficient documentation

## 2017-12-13 LAB — URINALYSIS, COMPLETE (UACMP) WITH MICROSCOPIC
Bilirubin Urine: NEGATIVE
GLUCOSE, UA: NEGATIVE mg/dL
Hgb urine dipstick: NEGATIVE
KETONES UR: NEGATIVE mg/dL
Nitrite: NEGATIVE
PH: 6 (ref 5.0–8.0)
Protein, ur: NEGATIVE mg/dL
Specific Gravity, Urine: 1.014 (ref 1.005–1.030)

## 2017-12-13 MED ORDER — MELOXICAM 15 MG PO TABS: 15.0000 mg | ORAL_TABLET | Freq: Every day | ORAL | 0 refills | Status: DC

## 2017-12-13 MED ORDER — CYCLOBENZAPRINE HCL 10 MG PO TABS: 10.0000 mg | ORAL_TABLET | Freq: Three times a day (TID) | ORAL | 0 refills | Status: DC | PRN

## 2017-12-13 NOTE — Discharge Instructions (Signed)
Please follow up with the orthopedist or primary care provider for symptoms that are not improving with medication and time.

## 2017-12-13 NOTE — ED Triage Notes (Signed)
C/O low back pain x 7 days.  States she took a home urine test showed microscopic blood in urine.  Reports that 10 days ago, injured back when she went to sit down missed the chair and hit low back on chair and fell to floor.  Denies dysuria.  States pain is intermittent.

## 2017-12-13 NOTE — ED Notes (Signed)
Pt ambulatory upon discharge. Verbalized understanding of discharge instructions, follow-up care and prescription. Understands she has urine culture outstanding. VSS. Skin warm and dry. A&O x4.

## 2017-12-13 NOTE — ED Notes (Signed)
Patient is complaining of mid-back pain, more on the right side.  Patient states she hit her back recently and believes pain may come from that.  Patient states, "A couple times this week it hurt more when I moved it."  Patient denies dysuria and frequency.  Patient is in no obvious distress at this time.  Sitting in relaxed manner in chair, texting on her phone.

## 2017-12-13 NOTE — ED Provider Notes (Signed)
[facility] Emergency Department Provider Note ____________________________________________  Time seen: Approximately 6:44 PM  I have reviewed the triage vital signs and the nursing notes.   HISTORY  Chief Complaint Back Pain    HPI Allison Colon is a 57 y.o. female who presents to the emergency department for evaluation and treatment of mid-back pain on the right side. She states that she started to sit down and missed the chair and hit her lower back on the chair and fell to the floor 6 days ago. She has taken 1/2 of a flexeril and ibuprofen and used heat with complete relief, but symptoms return intermittently.   Past Medical History:  Diagnosis Date  . Anemia   . Anxiety   . Elevated antinuclear antibody (ANA) level 2018  . Elevated hemoglobin A1c 2018  . Family history of heart disease   . Fibroid   . Hyperlipidemia   . Hypertension   . Low vitamin D level 2018  . STD (sexually transmitted disease)    HSV    Patient Active Problem List   Diagnosis Date Noted  . Palpitations 09/27/2017  . Primary osteoarthritis of both hands 02/13/2017  . Primary osteoarthritis of both knees 02/13/2017  . History of vitamin D deficiency 01/14/2017  . ANA positive 01/04/2017  . Multiple joint pain 01/04/2017  . Essential hypertension 10/12/2014  . Hyperlipidemia 10/12/2014  . HSV-2 (herpes simplex virus 2) infection 03/24/2014    Class: History of    Past Surgical History:  Procedure Laterality Date  . broken leg Bilateral    age 39  . OVARIAN CYST SURGERY    . PELVIC LAPAROSCOPY  12/2003   RSO    Prior to Admission medications   Medication Sig Start Date End Date Taking? Authorizing Provider  aspirin EC 325 MG tablet Take 325 mg by mouth at bedtime.    [provider]  cyclobenzaprine (FLEXERIL) 10 MG tablet Take 1 tablet (10 mg total) by mouth 3 (three) times daily as needed for muscle spasms. 12/13/17   Shyler Holzman B, FNP   enalapril (VASOTEC) 10 MG tablet Take 1 tablet (10 mg total) by mouth daily. 09/27/17   Lorretta Harp, MD  meloxicam (MOBIC) 15 MG tablet Take 1 tablet (15 mg total) by mouth daily. 12/13/17   Cayle Cordoba, Dessa Phi, FNP  Multiple Vitamins-Minerals (MULTIVITAMIN WITH MINERALS) tablet Take by mouth.    [provider]  rosuvastatin (CRESTOR) 10 MG tablet Take 1 tablet (10 mg total) by mouth daily. 09/27/17   Lorretta Harp, MD  valACYclovir (VALTREX) 500 MG tablet TAKE 1 TABLET BY MOUTH DAILY. INCREASE TO 1 TABLET TWICE DAILY AT OUTBREAK FOR 3 DAYS 12/14/16   Nunzio Cobbs, MD    Allergies Patient has no known allergies.  Family History  Problem Relation Age of Onset  . Diabetes Mother   . Hypertension Mother   . Hyperlipidemia Mother   . Heart disease Mother   . HIV Father   . Hypertension Father   . Heart disease Sister   . HIV Sister   . Hepatitis Brother   . Gout Brother        deceased  . Neuromuscular disorder Neg Hx     Social History Social History   Tobacco Use  . Smoking status: Never Smoker  . Smokeless tobacco: Never Used  Substance Use Topics  . Alcohol use: No    Alcohol/week: 0.0 oz  . Drug use: No  Review of Systems Constitutional: Negative for fever. Cardiovascular: Negative for chest pain. Respiratory: Negative for shortness of breath. Musculoskeletal: Positive for mid to lower back pain. Skin: Negative for rash, lesion, or wound.  Neurological: Negative for decrease in sensation. Negative for foot drop, negative for loss of bowel or bladder control, or change in urination.  ____________________________________________   PHYSICAL EXAM:  VITAL SIGNS: ED Triage Vitals  Enc Vitals Group     BP 12/13/17 1555 136/77     Pulse Rate 12/13/17 1555 77     Resp 12/13/17 1555 16     Temp 12/13/17 1555 98.4 F (36.9 C)     Temp Source 12/13/17 1555 Oral     SpO2 12/13/17 1555 100 %     Weight 12/13/17 1554 188 lb (85.3 kg)      Height 12/13/17 1554 5\' 1"  (1.549 m)     Head Circumference --      Peak Flow --      Pain Score 12/13/17 1554 0     Pain Loc --      Pain Edu? --      Excl. in [place]? --     Constitutional: Alert and oriented. Well appearing and in no acute distress. Eyes: Conjunctivae are clear without discharge or drainage Head: Atraumatic Neck: Supple Respiratory: No cough. Respirations are even and unlabored. Musculoskeletal: No reproducible midline tenderness over the thoracic or lumbar spine. Neurologic: Motor and sensory function is intact.  Cranial nerves II through XII normal as tested. Skin: Warm, dry, intact without rash, lesion, or wound. Psychiatric: Affect and behavior are appropriate.  ____________________________________________   LABS (all labs ordered are listed, but only abnormal results are displayed)  Labs Reviewed  URINALYSIS, COMPLETE (UACMP) WITH MICROSCOPIC - Abnormal; Notable for the following components:      Result Value   Color, Urine YELLOW (*)    APPearance CLEAR (*)    Leukocytes, UA TRACE (*)    Bacteria, UA RARE (*)    Squamous Epithelial / LPF 0-5 (*)    All other components within normal limits  URINE CULTURE   ____________________________________________  RADIOLOGY  Image of the lumbar spine is negative for acute bony abnormality per radiology. ____________________________________________   PROCEDURES  Procedures  ____________________________________________   INITIAL IMPRESSION / ASSESSMENT AND PLAN / ED COURSE  Allison Colon is a 57 y.o. who presents to the emergency department for treatment and evaluation of low back pain.  Images and exam are most consistent with musculoskeletal pain and she will be treated with Flexeril and meloxicam.  She was instructed to continue to use heat as well.  Patient instructed to follow-up with orthopedics for symptoms that are not improving over the week.  She was also instructed to return to the  emergency department for symptoms that change or worsen if unable schedule an appointment with orthopedics or primary care.  Medications - No data to display  Pertinent labs & imaging results that were available during my care of the patient were reviewed by me and considered in my medical decision making (see chart for details).  _________________________________________   FINAL CLINICAL IMPRESSION(S) / ED DIAGNOSES  Final diagnoses:  Acute lumbar back pain    ED Discharge Orders        Ordered    meloxicam (MOBIC) 15 MG tablet  Daily     12/13/17 1736    cyclobenzaprine (FLEXERIL) 10 MG tablet  3 times daily PRN     12/13/17 1736  If controlled substance prescribed during this visit, 12 month history viewed on the [place] prior to issuing an initial prescription for Schedule II or III opiod.    Victorino Dike, FNP 12/13/17 1850    Harvest Dark, MD 12/13/17 2014

## 2017-12-14 LAB — URINE CULTURE

## 2017-12-16 ENCOUNTER — Telehealth: Payer: Self-pay | Admitting: Emergency Medicine

## 2017-12-16 NOTE — Telephone Encounter (Signed)
Patient called for urine culture results.  Gave her results.  Explained that recollection was suggested.  She says she did a good clean specimen.  She does not have a pcp, so I explained that she can do ED follow up at [facility].

## 2018-02-11 ENCOUNTER — Encounter: Payer: Self-pay | Admitting: Cardiovascular Disease

## 2018-04-13 ENCOUNTER — Emergency Department (HOSPITAL_COMMUNITY)
Admission: EM | Admit: 2018-04-13 | Discharge: 2018-04-13 | Disposition: A | Discharge status: Home / Self Care | Payer: 59 | Attending: Emergency Medicine | Admitting: Emergency Medicine

## 2018-04-13 ENCOUNTER — Emergency Department (HOSPITAL_COMMUNITY): Payer: 59

## 2018-04-13 ENCOUNTER — Encounter (HOSPITAL_COMMUNITY): Payer: Self-pay | Admitting: Emergency Medicine

## 2018-04-13 DIAGNOSIS — K769 Liver disease, unspecified: Secondary | ICD-10-CM

## 2018-04-13 DIAGNOSIS — R1084 Generalized abdominal pain: Secondary | ICD-10-CM | POA: Diagnosis not present

## 2018-04-13 DIAGNOSIS — Z7982 Long term (current) use of aspirin: Secondary | ICD-10-CM | POA: Insufficient documentation

## 2018-04-13 DIAGNOSIS — Z79899 Other long term (current) drug therapy: Secondary | ICD-10-CM | POA: Diagnosis not present

## 2018-04-13 DIAGNOSIS — R109 Unspecified abdominal pain: Secondary | ICD-10-CM

## 2018-04-13 DIAGNOSIS — K7689 Other specified diseases of liver: Secondary | ICD-10-CM | POA: Diagnosis not present

## 2018-04-13 DIAGNOSIS — M545 Low back pain, unspecified: Secondary | ICD-10-CM

## 2018-04-13 DIAGNOSIS — I10 Essential (primary) hypertension: Secondary | ICD-10-CM | POA: Diagnosis not present

## 2018-04-13 LAB — URINALYSIS, ROUTINE W REFLEX MICROSCOPIC
BILIRUBIN URINE: NEGATIVE
GLUCOSE, UA: NEGATIVE mg/dL
Hgb urine dipstick: NEGATIVE
Ketones, ur: NEGATIVE mg/dL
LEUKOCYTES UA: NEGATIVE
Nitrite: NEGATIVE
Protein, ur: NEGATIVE mg/dL
SPECIFIC GRAVITY, URINE: 1.006 (ref 1.005–1.030)
pH: 6 (ref 5.0–8.0)

## 2018-04-13 LAB — CBC WITH DIFFERENTIAL/PLATELET
ABS IMMATURE GRANULOCYTES: 0 10*3/uL (ref 0.0–0.1)
Basophils Absolute: 0 10*3/uL (ref 0.0–0.1)
Basophils Relative: 0 %
Eosinophils Absolute: 0.3 10*3/uL (ref 0.0–0.7)
Eosinophils Relative: 3 %
HEMATOCRIT: 42.3 % (ref 36.0–46.0)
Hemoglobin: 13.7 g/dL (ref 12.0–15.0)
IMMATURE GRANULOCYTES: 0 %
LYMPHS ABS: 2.8 10*3/uL (ref 0.7–4.0)
Lymphocytes Relative: 31 %
MCH: 28.5 pg (ref 26.0–34.0)
MCHC: 32.4 g/dL (ref 30.0–36.0)
MCV: 87.9 fL (ref 78.0–100.0)
MONOS PCT: 11 %
Monocytes Absolute: 1 10*3/uL (ref 0.1–1.0)
NEUTROS ABS: 5 10*3/uL (ref 1.7–7.7)
NEUTROS PCT: 55 %
Platelets: 255 10*3/uL (ref 150–400)
RBC: 4.81 MIL/uL (ref 3.87–5.11)
RDW: 13.1 % (ref 11.5–15.5)
WBC: 9.2 10*3/uL (ref 4.0–10.5)

## 2018-04-13 LAB — PREGNANCY, URINE: Preg Test, Ur: NEGATIVE

## 2018-04-13 LAB — BASIC METABOLIC PANEL
ANION GAP: 7 (ref 5–15)
BUN: 15 mg/dL (ref 6–20)
CHLORIDE: 109 mmol/L (ref 98–111)
CO2: 25 mmol/L (ref 22–32)
Calcium: 9.6 mg/dL (ref 8.9–10.3)
Creatinine, Ser: 0.79 mg/dL (ref 0.44–1.00)
GFR calc Af Amer: >60 mL/min (ref >60)
GFR calc non Af Amer: >60 mL/min (ref >60)
Glucose, Bld: 107 mg/dL — ABNORMAL HIGH (ref 70–99)
POTASSIUM: 4.7 mmol/L (ref 3.5–5.1)
Sodium: 141 mmol/L (ref 135–145)

## 2018-04-13 LAB — HEPATIC FUNCTION PANEL
ALBUMIN: 3.8 g/dL (ref 3.5–5.0)
ALK PHOS: 46 U/L (ref 38–126)
ALT: 17 U/L (ref 0–44)
AST: 25 U/L (ref 15–41)
BILIRUBIN TOTAL: 0.6 mg/dL (ref 0.3–1.2)
Bilirubin, Direct: 0.1 mg/dL (ref 0.0–0.2)
Indirect Bilirubin: 0.5 mg/dL (ref 0.3–0.9)
Total Protein: 6.8 g/dL (ref 6.5–8.1)

## 2018-04-13 MED ORDER — IBUPROFEN 600 MG PO TABS: 600.0000 mg | ORAL_TABLET | Freq: Four times a day (QID) | ORAL | 0 refills | Status: DC | PRN

## 2018-04-13 MED ORDER — CYCLOBENZAPRINE HCL 10 MG PO TABS: 10.0000 mg | ORAL_TABLET | Freq: Three times a day (TID) | ORAL | 0 refills | Status: DC | PRN

## 2018-04-13 MED ORDER — METHOCARBAMOL 500 MG PO TABS: 500.0000 mg | ORAL_TABLET | Freq: Two times a day (BID) | ORAL | 0 refills | Status: DC

## 2018-04-13 NOTE — ED Triage Notes (Signed)
Patient reports intermittent right upper flank pain worse with movement/changing positions onset last week , denies recent injury /no hematuria or dysuria .

## 2018-04-13 NOTE — Discharge Instructions (Addendum)
Please see the information and instructions below regarding your visit.  Your diagnoses today include:  1. Right flank pain   2. Low back pain   3. Liver lesion    Tests performed today include: See side panel of your discharge paperwork for testing performed today. Vital signs are listed at the bottom of these instructions.   Medications prescribed:    Take any prescribed medications only as prescribed, and any over the counter medications only as directed on the packaging.  You are prescribed Robaxin, a muscle relaxant. Some common side effects of this medication include:  Feeling sleepy.  Dizziness. Take care upon going from a seated to a standing position.  Dry mouth.  Feeling tired or weak.  Hard stools (constipation).  Upset stomach. These are not all of the side effects that may occur. If you have questions about side effects, call your doctor. Call your primary care provider for medical advice about side effects.  This medication can be sedating. Only take this medication as needed. Please do not combine with alcohol. Do not drive or operate machinery while taking this medication.   This medication can interact with some other medications. Make sure to tell any provider you are taking this medication before they prescribe you a new medication.    Home care instructions:  Please follow any educational materials contained in this packet.   Follow-up instructions: Please follow-up with your primary care provider in one week for further evaluation of your symptoms if they are not completely improved.   Please follow-up with Ms. Powell or gastroenterology regarding right upper quadrant ultrasound or MRI of your liver and biliary tree.  Return instructions:  Please return to the Emergency Department if you experience worsening symptoms.  Please return the emergency department for any worsening pain, pain when you pee, fever or chills with your pain, or any new  symptoms. Please return if you have any other emergent concerns.  Additional Information:   Your vital signs today were: BP 119/68 (BP Location: Right Arm)    Pulse 75    Temp 98 F (36.7 C) (Oral)    Resp 16    SpO2 98%  If your blood pressure (BP) was elevated on multiple readings during this visit above 130 for the top number or above 80 for the bottom number, please have this repeated by your primary care provider within one month. --------------  Thank you for allowing [place] to participate in your care today.

## 2018-04-13 NOTE — ED Provider Notes (Signed)
[facility] EMERGENCY DEPARTMENT Provider Note   CSN: [id number] Arrival date & time: 04/13/18  3299     History   Chief Complaint Chief Complaint  Patient presents with  . Flank Pain    HPI Allison Colon is a 57 y.o. female.  HPI  Patient is a 57 year old female with a history of hyperlipidemia, hypertension, and anxiety presenting for right lower flank pain.  Patient reports that she feels it began back in March 2019 when she slipped off of a stool and fell onto her right flank.  Patient reports she was evaluated at that time, and plain films of the lumbar spine were normal.  Patient reports she persistently has pain from time to time with any movement "deep within" her right flank.  Patient reports that at the time she was evaluated in March 2019, she had a small amount of blood in the urine, then followed by urology which cleared her.  Patient reports that for the last 7 days, she has had daily pain with movement in the flank.  Patient denies any dysuria, urgency, frequency, hematuria, vaginal bleeding, or vaginal discharge.  Patient denies any fever or chills.  Patient reports normal bowel movements.  Patient reports she still periodically menstruates but is perimenopausal.  Last menses June 14.  Patient has abdominal surgical history significant for right oophorectomy in 2005.  Past Medical History:  Diagnosis Date  . Anemia   . Anxiety   . Elevated antinuclear antibody (ANA) level 2018  . Elevated hemoglobin A1c 2018  . Family history of heart disease   . Fibroid   . Hyperlipidemia   . Hypertension   . Low vitamin D level 2018  . STD (sexually transmitted disease)    HSV    Patient Active Problem List   Diagnosis Date Noted  . Palpitations 09/27/2017  . Primary osteoarthritis of both hands 02/13/2017  . Primary osteoarthritis of both knees 02/13/2017  . History of vitamin D deficiency 01/14/2017  . ANA positive 01/04/2017  . Multiple  joint pain 01/04/2017  . Essential hypertension 10/12/2014  . Hyperlipidemia 10/12/2014  . HSV-2 (herpes simplex virus 2) infection 03/24/2014    Class: History of    Past Surgical History:  Procedure Laterality Date  . broken leg Bilateral    age 59  . OVARIAN CYST SURGERY    . PELVIC LAPAROSCOPY  12/2003   RSO     OB History    Gravida  10   Para  1   Term  1   Preterm      AB  9   Living  1     SAB  1   TAB  8   Ectopic      Multiple      Live Births  1            Home Medications    Prior to Admission medications   Medication Sig Start Date End Date Taking? Authorizing Provider  aspirin EC 325 MG tablet Take 325 mg by mouth at bedtime.   Yes [provider]  enalapril (VASOTEC) 10 MG tablet Take 1 tablet (10 mg total) by mouth daily. 09/27/17  Yes Lorretta Harp, MD  Multiple Vitamins-Minerals (MULTIVITAMIN WITH MINERALS) tablet Take 1 tablet by mouth daily as needed.    Yes [provider]  rosuvastatin (CRESTOR) 10 MG tablet Take 1 tablet (10 mg total) by mouth daily. 09/27/17  Yes Lorretta Harp, MD  cyclobenzaprine (  FLEXERIL) 10 MG tablet Take 1 tablet (10 mg total) by mouth 3 (three) times daily as needed for muscle spasms. Patient not taking: Reported on 04/13/2018 12/13/17   Sherrie George B, FNP  meloxicam (MOBIC) 15 MG tablet Take 1 tablet (15 mg total) by mouth daily. Patient not taking: Reported on 04/13/2018 12/13/17   Sherrie George B, FNP  valACYclovir (VALTREX) 500 MG tablet TAKE 1 TABLET BY MOUTH DAILY. INCREASE TO 1 TABLET TWICE DAILY AT OUTBREAK FOR 3 DAYS Patient not taking: Reported on 04/13/2018 12/14/16   Nunzio Cobbs, MD    Family History Family History  Problem Relation Age of Onset  . Diabetes Mother   . Hypertension Mother   . Hyperlipidemia Mother   . Heart disease Mother   . HIV Father   . Hypertension Father   . Heart disease Sister   . HIV Sister   . Hepatitis Brother   . Gout Brother         deceased  . Neuromuscular disorder Neg Hx     Social History Social History   Tobacco Use  . Smoking status: Never Smoker  . Smokeless tobacco: Never Used  Substance Use Topics  . Alcohol use: No    Alcohol/week: 0.0 standard drinks  . Drug use: No     Allergies   Patient has no known allergies.   Review of Systems Review of Systems  Constitutional: Negative for chills and fever.  HENT: Negative for congestion and sore throat.   Eyes: Negative for visual disturbance.  Respiratory: Negative for cough, chest tightness and shortness of breath.   Cardiovascular: Negative for chest pain, palpitations and leg swelling.  Gastrointestinal: Negative for abdominal pain, nausea and vomiting.  Genitourinary: Positive for flank pain. Negative for dysuria, frequency, vaginal bleeding and vaginal discharge.  Musculoskeletal: Positive for back pain. Negative for myalgias.  Skin: Negative for rash.  Neurological: Negative for dizziness and syncope.     Physical Exam Updated Vital Signs BP 119/68 (BP Location: Right Arm)   Pulse 75   Temp 98 F (36.7 C) (Oral)   Resp 16   SpO2 98%   Physical Exam  Constitutional: She appears well-developed and well-nourished. No distress.  HENT:  Head: Normocephalic and atraumatic.  Mouth/Throat: Oropharynx is clear and moist.  Eyes: Pupils are equal, round, and reactive to light. Conjunctivae and EOM are normal.  Neck: Normal range of motion. Neck supple.  Cardiovascular: Normal rate, regular rhythm, S1 normal and S2 normal.  No murmur heard. Pulmonary/Chest: Effort normal and breath sounds normal. She has no wheezes. She has no rales.  Abdominal: Soft. She exhibits no distension. There is no tenderness. There is no guarding.  No CVA tenderness.  Musculoskeletal: Normal range of motion. She exhibits no edema or deformity.  Tenderness to deep palpation of the right paraspinal musculature and flank approximately the level of the iliac  spine. 5 out of 5 strength bilateral lower extremities with normal and symmetric gait.  Neurological: She is alert.  Cranial nerves grossly intact. Patient moves extremities symmetrically and with good coordination.  Skin: Skin is warm and dry. No rash noted. No erythema.  Psychiatric: She has a normal mood and affect. Her behavior is normal. Judgment and thought content normal.  Nursing note and vitals reviewed.    ED Treatments / Results  Labs (all labs ordered are listed, but only abnormal results are displayed) Labs Reviewed  URINALYSIS, ROUTINE W REFLEX MICROSCOPIC - Abnormal; Notable for the following  components:      Result Value   Color, Urine STRAW (*)    All other components within normal limits  BASIC METABOLIC PANEL - Abnormal; Notable for the following components:   Glucose, Bld 107 (*)    All other components within normal limits  CBC WITH DIFFERENTIAL/PLATELET  PREGNANCY, URINE  HEPATIC FUNCTION PANEL    EKG None  Radiology Ct L-spine No Charge  Result Date: 04/13/2018 CLINICAL DATA:  Low back pain EXAM: CT LUMBAR SPINE WITHOUT CONTRAST TECHNIQUE: Multidetector CT imaging of the lumbar spine was performed without intravenous contrast administration. Multiplanar CT image reconstructions were also generated. COMPARISON:  Lumbar radiographs 12/13/2017 FINDINGS: Segmentation: Normal Alignment: Normal Vertebrae: Negative for fracture or mass Paraspinal and other soft tissues: Left adnexal cyst measuring 4 x 5 cm. No free fluid in the pelvis. No paraspinous mass Disc levels: T12-L1: Negative L1-2: Negative L2-3: Negative L3-4: Negative L4-5: Moderate facet degeneration with bony overgrowth and gas in the joint bilaterally left greater than right. No significant spinal or foraminal stenosis. Slight disc bulging. L5-S1: Small left foraminal disc protrusion without neural compression. Mild facet degeneration bilaterally. IMPRESSION: Moderate facet degeneration at L4-5 without  significant stenosis Small left foraminal disc protrusion L5-S1 without neural compression. 4 x 5 cm left adnexal cyst.  No free fluid in the pelvis. Electronically Signed   By: Franchot Gallo M.D.   On: 04/13/2018 09:34   Ct Renal Stone Study  Result Date: 04/13/2018 CLINICAL DATA:  Intermittent right upper quadrant/flank pain. Worse with movement. Onset last week. EXAM: CT ABDOMEN AND PELVIS WITHOUT CONTRAST TECHNIQUE: Multidetector CT imaging of the abdomen and pelvis was performed following the standard protocol without IV contrast. COMPARISON:  Plain film 12/13/2017 of the lumbar spine. No prior CTs. Today's lumbar spine dedicated images are dictated separately. FINDINGS: Lower chest: Cylindrical bronchiectasis in the left lower lobe is likely post infectious, including on image 4/5. Normal heart size without pericardial or pleural effusion. Hepatobiliary: 1.9 cm inferior right hepatic lobe hypoattenuating lesion measures greater than fluid density on 28/4. Normal gallbladder, without biliary ductal dilatation. Pancreas: Normal, without mass or ductal dilatation. Spleen: Normal in size, without focal abnormality. Adrenals/Urinary Tract: Normal adrenal glands. Punctate right renal inter/upper pole calcification on 73/7 is favored to be parenchymal rather than within the renal collecting system. No hydroureter or ureteric calculi. No bladder calculi. Stomach/Bowel: Gastric antral underdistention. Colonic stool burden suggests constipation. Normal terminal ileum and appendix. Normal small bowel. Vascular/Lymphatic: Normal caliber of the aorta and branch vessels. No abdominopelvic adenopathy. Reproductive: Left pelvic/adnexal 5.2 x 3.8 cm fluid density lesion is likely a cyst. Other: No significant free fluid. Musculoskeletal: No acute osseous abnormality. IMPRESSION: 1.  No acute process in the abdomen or pelvis. 2.  Possible constipation. 3. Punctate calcification in the right kidney is felt to be parenchymal  and dystrophic. Nephrolithiasis felt less likely. 4. 1.9 cm right hepatic lobe lesion measures greater than a simple cyst. Most likely a benign lesion such as a hemangioma. Consider nonemergent right upper quadrant ultrasound to further characterize. Especially if there is any history of primary malignancy or liver disease, non emergent outpatient pre and post contrast abdominal MRI could alternatively be performed. Electronically Signed   By: Abigail Miyamoto M.D.   On: 04/13/2018 09:44    Procedures Procedures (including critical care time)  Medications Ordered in ED Medications - No data to display   Initial Impression / Assessment and Plan / ED Course  I have reviewed the triage  vital signs and the nursing notes.  Pertinent labs & imaging results that were available during my care of the patient were reviewed by me and considered in my medical decision making (see chart for details).     Patient is nontoxic-appearing, afebrile, with benign abdomen.  Differential diagnosis includes radiculopathy, right renal mass, nephrolithiasis, pyelonephritis.  Given time course of symptoms, and pain with movement, as well as benign exam, also considering MSK etiology.  Patient with reassuring work-up with normal kidney function, normal urinalysis, no evidence of leukocytosis, normal hepatic function panel.  Patient CT renal stone study demonstrates punctate calcification of the kidney, which was discussed with Dr. Carlis Abbott of radiology, who states that this is a benign finding.  Additionally, patient had a hepatic lesion, likely hemangioma, but recommend outpatient ultrasound versus MRI for follow-up.  Patient was informed of these results, and discussed appropriate plans for follow-up.  Patient also had left ovarian cyst, and she is still menstruating.  Patient benign work-up, suspect musculoskeletal etiology of patient's pain.  Will prescribe muscle relaxants.  Patient can return precautions any worsening  pain, fever or chills, or intractable nausea or vomiting.  Patient is in understanding and agrees with the plan of care.  Final Clinical Impressions(s) / ED Diagnoses   Final diagnoses:  Low back pain  Right flank pain  Liver lesion      Tamala Julian 04/13/18 1759    Hayden Rasmussen, MD 04/14/18 1152

## 2018-04-13 NOTE — ED Notes (Signed)
Declined W/C at D/C and was escorted to lobby by RN. 

## 2018-04-13 NOTE — ED Triage Notes (Signed)
Pt reports RT flank pain and denies radiation of pain to legs . Pt verbalizes she does not have any urinary Sx's . Pt reports pain increases with movement.

## 2018-04-25 ENCOUNTER — Ambulatory Visit: Payer: 59 | Admitting: Family Medicine

## 2018-04-25 DIAGNOSIS — Z0289 Encounter for other administrative examinations: Secondary | ICD-10-CM

## 2018-04-25 NOTE — Progress Notes (Deleted)
Allison Colon - 57 y.o. female MRN [id number]  Date of birth: 1961/01/25  SUBJECTIVE:  Including CC & ROS.  No chief complaint on file.   Allison Colon is a 57 y.o. female that is  ***.  ***   Review of Systems  HISTORY: Past Medical, Surgical, Social, and Family History Reviewed & Updated per EMR.   Pertinent Historical Findings include:  Past Medical History:  Diagnosis Date  . Anemia   . Anxiety   . Elevated antinuclear antibody (ANA) level 2018  . Elevated hemoglobin A1c 2018  . Family history of heart disease   . Fibroid   . Hyperlipidemia   . Hypertension   . Low vitamin D level 2018  . STD (sexually transmitted disease)    HSV    Past Surgical History:  Procedure Laterality Date  . broken leg Bilateral    age 19  . OVARIAN CYST SURGERY    . PELVIC LAPAROSCOPY  12/2003   RSO    No Known Allergies  Family History  Problem Relation Age of Onset  . Diabetes Mother   . Hypertension Mother   . Hyperlipidemia Mother   . Heart disease Mother   . HIV Father   . Hypertension Father   . Heart disease Sister   . HIV Sister   . Hepatitis Brother   . Gout Brother        deceased  . Neuromuscular disorder Neg Hx      Social History   Socioeconomic History  . Marital status: Married    Spouse name: Not on file  . Number of children: Not on file  . Years of education: Not on file  . Highest education level: Not on file  Occupational History  . Not on file  Social Needs  . Financial resource strain: Not on file  . Food insecurity:    Worry: Not on file    Inability: Not on file  . Transportation needs:    Medical: Not on file    Non-medical: Not on file  Tobacco Use  . Smoking status: Never Smoker  . Smokeless tobacco: Never Used  Substance and Sexual Activity  . Alcohol use: No    Alcohol/week: 0.0 standard drinks  . Drug use: No  . Sexual activity: Yes    Partners: Male    Birth control/protection: None  Lifestyle  .  Physical activity:    Days per week: Not on file    Minutes per session: Not on file  . Stress: Not on file  Relationships  . Social connections:    Talks on phone: Not on file    Gets together: Not on file    Attends religious service: Not on file    Active member of club or organization: Not on file    Attends meetings of clubs or organizations: Not on file    Relationship status: Not on file  . Intimate partner violence:    Fear of current or ex partner: Not on file    Emotionally abused: Not on file    Physically abused: Not on file    Forced sexual activity: Not on file  Other Topics Concern  . Not on file  Social History Narrative  . Not on file     PHYSICAL EXAM:  VS: There were no vitals taken for this visit. Physical Exam Gen: NAD, alert, cooperative with exam, well-appearing ENT: normal lips, normal nasal mucosa,  Eye: normal EOM, normal conjunctiva and lids  CV:  no edema, +2 pedal pulses   Resp: no accessory muscle use, non-labored,  GI: no masses or tenderness, no hernia  Skin: no rashes, no areas of induration  Neuro: normal tone, normal sensation to touch Psych:  normal insight, alert and oriented MSK:  ***      ASSESSMENT & PLAN:   No problem-specific Assessment & Plan notes found for this encounter.

## 2018-06-16 ENCOUNTER — Emergency Department
Admission: EM | Admit: 2018-06-16 | Discharge: 2018-06-16 | Disposition: A | Discharge status: Home / Self Care | Payer: 59 | Attending: Emergency Medicine | Admitting: Emergency Medicine

## 2018-06-16 ENCOUNTER — Other Ambulatory Visit: Payer: Self-pay

## 2018-06-16 DIAGNOSIS — R200 Anesthesia of skin: Secondary | ICD-10-CM | POA: Insufficient documentation

## 2018-06-16 DIAGNOSIS — Z5321 Procedure and treatment not carried out due to patient leaving prior to being seen by health care provider: Secondary | ICD-10-CM | POA: Diagnosis not present

## 2018-06-16 LAB — COMPREHENSIVE METABOLIC PANEL
ALK PHOS: 53 U/L (ref 38–126)
ALT: 17 U/L (ref 0–44)
AST: 25 U/L (ref 15–41)
Albumin: 4.5 g/dL (ref 3.5–5.0)
Anion gap: 5 (ref 5–15)
BILIRUBIN TOTAL: 0.4 mg/dL (ref 0.3–1.2)
BUN: 13 mg/dL (ref 6–20)
CALCIUM: 9.6 mg/dL (ref 8.9–10.3)
CO2: 26 mmol/L (ref 22–32)
CREATININE: 0.75 mg/dL (ref 0.44–1.00)
Chloride: 108 mmol/L (ref 98–111)
GFR calc non Af Amer: >60 mL/min (ref >60)
Glucose, Bld: 96 mg/dL (ref 70–99)
Potassium: 4.4 mmol/L (ref 3.5–5.1)
Sodium: 139 mmol/L (ref 135–145)
TOTAL PROTEIN: 7.8 g/dL (ref 6.5–8.1)

## 2018-06-16 LAB — CBC
HEMATOCRIT: 44.2 % (ref 36.0–46.0)
HEMOGLOBIN: 14.4 g/dL (ref 12.0–15.0)
MCH: 28.4 pg (ref 26.0–34.0)
MCHC: 32.6 g/dL (ref 30.0–36.0)
MCV: 87.2 fL (ref 80.0–100.0)
Platelets: 257 10*3/uL (ref 150–400)
RBC: 5.07 MIL/uL (ref 3.87–5.11)
RDW: 13.2 % (ref 11.5–15.5)
WBC: 10.4 10*3/uL (ref 4.0–10.5)
nRBC: 0 % (ref 0.0–0.2)

## 2018-06-16 LAB — TROPONIN I

## 2018-06-16 NOTE — ED Triage Notes (Signed)
Pt in with co numbness from right elbow to right shoulder since 1600 no hx of the same. Pt denies any other symptoms or deficits.

## 2018-06-16 NOTE — ED Notes (Signed)
Room found to be empty w/ no signs of patient return.  Stat desk reports seeing patient leave from lobby.

## 2018-09-02 ENCOUNTER — Telehealth: Payer: Self-pay | Admitting: Cardiovascular Disease

## 2018-09-02 DIAGNOSIS — Z79899 Other long term (current) drug therapy: Secondary | ICD-10-CM

## 2018-09-02 DIAGNOSIS — E785 Hyperlipidemia, unspecified: Secondary | ICD-10-CM

## 2018-09-02 DIAGNOSIS — I10 Essential (primary) hypertension: Secondary | ICD-10-CM

## 2018-09-02 NOTE — Telephone Encounter (Signed)
New Message   Pt is wondering if she needs to have her Lipid Profile and A1C done prior to her f/u appt. Please call

## 2018-09-02 NOTE — Telephone Encounter (Signed)
FASTING LAB ORDERS ENTERED-A1C W/PCP Pt notified, Pt verbalized understanding. No further questions. She will have them done 3-4 days before appt to review at appt

## 2018-09-16 ENCOUNTER — Other Ambulatory Visit: Payer: Self-pay | Admitting: Neurology

## 2018-09-30 ENCOUNTER — Ambulatory Visit: Payer: 59 | Admitting: Cardiovascular Disease

## 2018-10-23 ENCOUNTER — Other Ambulatory Visit: Payer: Self-pay

## 2018-10-23 DIAGNOSIS — Z79899 Other long term (current) drug therapy: Secondary | ICD-10-CM

## 2018-10-23 DIAGNOSIS — I10 Essential (primary) hypertension: Secondary | ICD-10-CM

## 2018-10-23 DIAGNOSIS — E785 Hyperlipidemia, unspecified: Secondary | ICD-10-CM

## 2018-10-23 LAB — HEPATIC FUNCTION PANEL
ALK PHOS: 62 IU/L (ref 39–117)
ALT: 18 IU/L (ref 0–32)
AST: 23 IU/L (ref 0–40)
Albumin: 4.5 g/dL (ref 3.8–4.9)
BILIRUBIN TOTAL: 0.4 mg/dL (ref 0.0–1.2)
Bilirubin, Direct: 0.11 mg/dL (ref 0.00–0.40)
Total Protein: 7.2 g/dL (ref 6.0–8.5)

## 2018-10-23 LAB — LIPID PANEL
CHOLESTEROL TOTAL: 172 mg/dL (ref 100–199)
Chol/HDL Ratio: 3.1 ratio (ref 0.0–4.4)
HDL: 56 mg/dL (ref >39)
LDL Calculated: 96 mg/dL (ref 0–99)
Triglycerides: 100 mg/dL (ref 0–149)
VLDL Cholesterol Cal: 20 mg/dL (ref 5–40)

## 2018-10-24 ENCOUNTER — Other Ambulatory Visit: Payer: Self-pay

## 2018-10-24 ENCOUNTER — Ambulatory Visit: Payer: 59 | Admitting: Cardiovascular Disease

## 2018-10-24 ENCOUNTER — Encounter: Payer: Self-pay | Admitting: Cardiovascular Disease

## 2018-10-24 ENCOUNTER — Telehealth: Payer: Self-pay | Admitting: Cardiovascular Disease

## 2018-10-24 DIAGNOSIS — E782 Mixed hyperlipidemia: Secondary | ICD-10-CM | POA: Diagnosis not present

## 2018-10-24 DIAGNOSIS — I10 Essential (primary) hypertension: Secondary | ICD-10-CM

## 2018-10-24 MED ORDER — ENALAPRIL MALEATE 10 MG PO TABS: 10.0000 mg | ORAL_TABLET | Freq: Every day | ORAL | 3 refills | Status: AC

## 2018-10-24 MED ORDER — ROSUVASTATIN CALCIUM 10 MG PO TABS: 10.0000 mg | ORAL_TABLET | Freq: Every day | ORAL | 3 refills | Status: AC

## 2018-10-24 NOTE — Telephone Encounter (Signed)
New Message   PT is calling to speak with the nurse, she said her refills were put in for 3 months and their should be 12 months  Please call back

## 2018-10-24 NOTE — Assessment & Plan Note (Signed)
History of essential hypertension her blood pressure measured today 122/70.  She is on enalapril.
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
## 2018-10-24 NOTE — Patient Instructions (Signed)
Medication Instructions:  Your physician recommends that you continue on your current medications as directed. Please refer to the Current Medication list given to you today.  If you need a refill on your cardiac medications before your next appointment, please call your pharmacy.   Lab work: NONE If you have labs (blood work) drawn today and your tests are completely normal, you will receive your results only by: . MyChart Message (if you have MyChart) OR . A paper copy in the mail If you have any lab test that is abnormal or we need to change your treatment, we will call you to review the results.  Testing/Procedures: NONE  Follow-Up: At [facility], you and your health needs are our priority.  As part of our continuing mission to provide you with exceptional heart care, we have created designated Provider Care Teams.  These Care Teams include your primary Cardiologist (physician) and Advanced Practice Providers (APPs -  Physician Assistants and Nurse Practitioners) who all work together to provide you with the care you need, when you need it. . You will need a follow up appointment in 12 months.  Please call our office 2 months in advance to schedule this appointment.  You may see Dr. Berry or one of the following Advanced Practice Providers on your designated Care Team:   . Luke Kilroy, PA-C . Hao Meng, PA-C . Angela Duke, PA-C . Kathryn Lawrence, DNP . Nilam Barrett, PA-C . Krista Kroeger, PA-C . Callie Goodrich, PA-C     

## 2018-10-24 NOTE — Telephone Encounter (Signed)
Patient states she only has 30 pills in each of medication bottles that were filled today -- enalapril and rosuvastatin.  RN informed patient prescription was e -sent as 90 tablets and 3 refill. RN INFORM PATIENT TO CONTACT PHARMACY  TO VERIFY.  PATIENT VERBALIZED UNDERSTANDING.

## 2018-10-24 NOTE — Telephone Encounter (Signed)
New Message   PT is calling because she doesn't see her results for her labs on MyChart and should would like them to be posted.

## 2018-10-24 NOTE — Assessment & Plan Note (Signed)
History of hyperlipidemia on statin therapy with recent lipid profile performed 10/23/2018 revealing a total score of 172, LDL 96 and HDL 56.

## 2018-10-24 NOTE — Telephone Encounter (Signed)
Patient results released. No other questions.

## 2019-01-06 ENCOUNTER — Telehealth: Payer: Self-pay | Admitting: Cardiovascular Disease

## 2019-01-06 NOTE — Telephone Encounter (Signed)
Patient states that since this Covid-19 she notices her anxiety level is increased either by going out of the house, or if she stays in the house so much. States she only feels the palpitations when she feels her anxiety level gets high.  Was given a steroid yesterday- she states that since yesterday the palpitations have worsened, and she states it made her feel depressed and is not taking it anymore.   Denies SOB, swelling.   Advised patient to reach out to a Primary Care Doctor regarding her anxiety level, but she wants to go through her Cardiologist instead. Advised that normally medications for anxiety are managed by PCP, but I would route a message to [facility] for his recommendations.

## 2019-01-06 NOTE — Telephone Encounter (Signed)
Needs to go through her PCP

## 2019-01-06 NOTE — Telephone Encounter (Signed)
Patient called today stating she has been dealing with anxiety and having heart palpations. Patient has not reached out to PCP in regards to her anxiety.

## 2019-01-08 NOTE — Telephone Encounter (Signed)
Pt also sent mychart message with the same concerns. Responded to message via mychart and advised to consult PCP

## 2019-01-15 ENCOUNTER — Encounter: Payer: Self-pay | Admitting: Family Medicine

## 2019-01-15 ENCOUNTER — Other Ambulatory Visit: Payer: Self-pay | Admitting: Neurology

## 2019-01-15 ENCOUNTER — Ambulatory Visit (INDEPENDENT_AMBULATORY_CARE_PROVIDER_SITE_OTHER): Payer: 59 | Admitting: Family Medicine

## 2019-01-15 VITALS — BP 122/83 | HR 68 | Ht 61.0 in | Wt 188.0 lb

## 2019-01-15 DIAGNOSIS — M79604 Pain in right leg: Secondary | ICD-10-CM | POA: Diagnosis not present

## 2019-01-15 DIAGNOSIS — M170 Bilateral primary osteoarthritis of knee: Secondary | ICD-10-CM

## 2019-01-15 DIAGNOSIS — G518 Other disorders of facial nerve: Secondary | ICD-10-CM

## 2019-01-15 DIAGNOSIS — I10 Essential (primary) hypertension: Secondary | ICD-10-CM

## 2019-01-15 DIAGNOSIS — F411 Generalized anxiety disorder: Secondary | ICD-10-CM | POA: Diagnosis not present

## 2019-01-15 DIAGNOSIS — M79605 Pain in left leg: Secondary | ICD-10-CM

## 2019-01-15 DIAGNOSIS — N951 Menopausal and female climacteric states: Secondary | ICD-10-CM

## 2019-01-15 NOTE — Assessment & Plan Note (Signed)
Unable to do exam today. Has upcoming ortho appointment to discuss b/l leg pain, hip pain, and back pain. Offered PT, but she will wait for ortho evaluation. Advised home exercise (already biking) and stretching routine.

## 2019-01-15 NOTE — Progress Notes (Signed)
I connected with Allison Colon on 01/15/19 at 10:00 AM EDT by video and verified that I am speaking with the correct person using two identifiers.   I discussed the limitations, risks, security and privacy concerns of performing an evaluation and management service by video and the availability of in person appointments. I also discussed with the patient that there may be a patient responsible charge related to this service. The patient expressed understanding and agreed to proceed.  Patient location: Home Provider Location: [place] Participants: Allison Colon and Allison Colon   Subjective:     Allison Colon is a 58 y.o. female presenting for [place] (moved from [place] in 2011. No PCP here. Sees Dr. Elwyn Reach. Goes to [place].); Anxiety (wonders if she should see an endocrinologist for her menopause symptoms.); and Headache (seen Dr. Shah-neurology and has MRI scheduled.)     HPI   #General complains - more anxiety since COVID-19 - wakes up feeling stiff in the morning - gained 20 lbs in the last few years - told she is going through menopause - period 4 times a per year - last blood work had low progesterone - Dexa with osteopenia  - not getting along well with OB/GYN - feels like it takes time to get going in the morning - feels like her body is not the same - will get occasionally get head discomfort which go away quickly - feels like she occasionally has some loss of mental clarity - feels comfortable at home - but nervous when she is outside - the joint symptoms are the most worrisome symptoms - every once in a while she gets anxiety, but that is OK - Anxiety - not interested in medication at this time - she can find the trigger for her - getting sleep with melatonin gummies   Menopause - hot flashes, not a lot of vaginal dryness, getting some nightsweats  Will occasionally get pain - joint  stiffness - waist down - not painful - stretching is helpful - when she gets up first thing in the morning has to get moving - will feel tight muscles throughout the legs - whole leg including feet - new issue for her over the last few years - no hx of this in the past - Hx of back pain - had an CT done - facet degeneration and disc protrusion - does have an appointment June 1 with Dr. Rolena Infante > orthopedic surgeon at [facility]   Review of Systems  Endocrine: Negative for cold intolerance and heat intolerance.  Genitourinary: Negative for vaginal pain.  See HPI   Social History   Tobacco Use  Smoking Status Never Smoker  Smokeless Tobacco Never Used        Objective:   BP Readings from Last 3 Encounters:  01/15/19 122/83  10/24/18 122/70  06/16/18 123/73   Wt Readings from Last 3 Encounters:  01/15/19 188 lb (85.3 kg)  10/24/18 194 lb 6.4 oz (88.2 kg)  06/16/18 185 lb (83.9 kg)    BP 122/83 Comment: per patient  Pulse 68 Comment: per patient  Ht 5\' 1"  (1.549 m) Comment: per patient  Wt 188 lb (85.3 kg) Comment: per patient  LMP 10/11/2018   BMI 35.52 kg/m   Physical Exam Constitutional:      Appearance: Normal appearance. She is not ill-appearing.  HENT:     Head: Normocephalic and atraumatic.     Right Ear: External  ear normal.     Left Ear: External ear normal.  Eyes:     Conjunctiva/sclera: Conjunctivae normal.  Pulmonary:     Effort: Pulmonary effort is normal. No respiratory distress.  Neurological:     Mental Status: She is alert. Mental status is at baseline.  Psychiatric:        Mood and Affect: Mood normal.        Behavior: Behavior normal.        Thought Content: Thought content normal.        Judgment: Judgment normal.    Depression screen PHQ 2/9 01/15/2019  Decreased Interest 3  Down, Depressed, Hopeless 1  PHQ - 2 Score 4  Altered sleeping 3  Tired, decreased energy 3  Change in appetite 3  Feeling bad or failure about yourself   0  Trouble concentrating 1  Moving slowly or fidgety/restless 0  Suicidal thoughts 0  PHQ-9 Score 14  Difficult doing work/chores Somewhat difficult   GAD 7 : Generalized Anxiety Score 01/15/2019  Nervous, Anxious, on Edge 1  Control/stop worrying 2  Worry too much - different things 2  Trouble relaxing 2  Restless 2  Easily annoyed or irritable 2  Afraid - awful might happen 3  Total GAD 7 Score 14  Anxiety Difficulty Somewhat difficult            Assessment & Plan:   Problem List Items Addressed This Visit      Cardiovascular and Mediastinum   Essential hypertension - Primary    BP with good control on current meds. Follows with cardiology        Other   Primary osteoarthritis of both knees   Generalized anxiety disorder    Per report and based on GAD. Pt managing currently and not interested in starting medication. Was previously prescribed effexor but does not want to start.       Relevant Medications   venlafaxine XR (EFFEXOR-XR) 37.5 MG 24 hr capsule   Bilateral leg pain    Unable to do exam today. Has upcoming ortho appointment to discuss b/l leg pain, hip pain, and back pain. Offered PT, but she will wait for ortho evaluation. Advised home exercise (already biking) and stretching routine.       Menopause syndrome    At this point she has a constellation of symptoms, however, is not particularly bothered by hot flashes or vaginal dryness. Discussed that we could do an endocrine referral but I do not think hormones would do much to help with leg symptoms. She is OK waiting and is also going to look into finding a new OB/GYN because she has not felt listened too at her current provider.           Return if symptoms worsen or fail to improve.  Allison Noe, MD

## 2019-01-15 NOTE — Assessment & Plan Note (Signed)
BP with good control on current meds. Follows with cardiology

## 2019-01-15 NOTE — Assessment & Plan Note (Signed)
Per report and based on GAD. Pt managing currently and not interested in starting medication. Was previously prescribed effexor but does not want to start.

## 2019-01-15 NOTE — Patient Instructions (Signed)
How to help anxiety - without medication.   1) Regular Exercise - walking, jogging, cycling, dancing, strength training   2)  Begin a Mindfulness/Meditation practice -- this can take a little as 3 minutes and is helpful for all kinds of mood issues  -- You can find resources in books  -- Or you can download apps like  ---- Headspace App (which currently has free content called "Weathering the Storm")  ---- Calm (which has a few free options)  ---- Insignt Timer  ---- Stop, Breathe & Think   # With each of these Apps - you should decline the "start free trial" offer and as you search through the App should be able to access some of their free content. You can also chose to pay for the content if you find one that works well for you.   # Many of them also offer sleep specific content which may help with insomnia   3) Healthy Diet  -- Avoid or decrease Caffeine  -- Avoid or decrease Alcohol  -- Drink plenty of water, have a balanced diet  -- Avoid cigarettes and marijuana (as well as other recreational drugs)   4) Consider contacting a professional therapist   

## 2019-01-15 NOTE — Assessment & Plan Note (Signed)
At this point she has a constellation of symptoms, however, is not particularly bothered by hot flashes or vaginal dryness. Discussed that we could do an endocrine referral but I do not think hormones would do much to help with leg symptoms. She is OK waiting and is also going to look into finding a new OB/GYN because she has not felt listened too at her current provider.

## 2019-01-28 ENCOUNTER — Ambulatory Visit: Payer: 59

## 2019-01-30 ENCOUNTER — Ambulatory Visit: Payer: 59 | Admitting: Cardiovascular Disease

## 2019-02-05 ENCOUNTER — Ambulatory Visit: Payer: 59

## 2019-09-28 ENCOUNTER — Other Ambulatory Visit: Payer: Self-pay | Admitting: Student

## 2019-09-28 DIAGNOSIS — R0602 Shortness of breath: Secondary | ICD-10-CM

## 2019-10-05 ENCOUNTER — Other Ambulatory Visit: Payer: Self-pay | Admitting: Student

## 2019-10-05 DIAGNOSIS — R002 Palpitations: Secondary | ICD-10-CM

## 2019-10-14 ENCOUNTER — Other Ambulatory Visit: Payer: Self-pay | Admitting: Obstetrics and Gynecology

## 2019-10-14 DIAGNOSIS — Z1231 Encounter for screening mammogram for malignant neoplasm of breast: Secondary | ICD-10-CM

## 2019-10-15 ENCOUNTER — Ambulatory Visit: Admission: RE | Admit: 2019-10-15 | Payer: Managed Care, Other (non HMO) | Source: Ambulatory Visit

## 2019-11-25 ENCOUNTER — Ambulatory Visit
Admission: RE | Admit: 2019-11-25 | Discharge: 2019-11-25 | Disposition: A | Discharge status: Home / Self Care | Payer: Managed Care, Other (non HMO) | Source: Ambulatory Visit | Attending: Obstetrics and Gynecology | Admitting: Obstetrics and Gynecology

## 2019-11-25 DIAGNOSIS — Z1231 Encounter for screening mammogram for malignant neoplasm of breast: Secondary | ICD-10-CM | POA: Insufficient documentation

## 2019-12-02 ENCOUNTER — Other Ambulatory Visit: Payer: Self-pay | Admitting: Obstetrics and Gynecology

## 2019-12-10 NOTE — H&P (Signed)
Allison Colon is a 59 y.o. female here forFractional D+C , h/s and myosure resection of submucosal fibroid  .pt is here for continued work up for PMB . Labs c/w menopausal state . She has not had bleeding since July 2021.   EMBX : neg     Past Medical History:  has a past medical history of Benign essential hypertension (02/24/2019), Hyperlipidemia, Hyperlipidemia, mixed (02/24/2019), Hypertension, Osteopenia, and Pre-diabetes.  Past Surgical History:  has a past surgical history that includes right ovary removed by laparscopy and uterine myomectomy. Family History: family history includes Myocardial Infarction (Heart attack) in her mother. Social History:  reports that she has never smoked. She has never used smokeless tobacco. She reports that she does not drink alcohol or use drugs. OB/GYN History:          OB History    Gravida  1   Para  1   Term      Preterm      AB      Living  1     SAB      TAB      Ectopic      Molar      Multiple      Live Births  1          Allergies: has No Known Allergies. Medications:  Current Outpatient Medications:  .  aspirin 81 MG EC tablet, Take 1 tablet (81 mg total) by mouth once daily, Disp: 30 tablet, Rfl: 11 .  azelastine (OPTIVAR) 0.05 % ophthalmic solution, INSTILL 1 DROP INTO AFFECTED EYE TWICE A DAY, Disp: , Rfl:  .  cetirizine (ZYRTEC) 10 MG tablet, Take 10 mg by mouth once daily, Disp: , Rfl:  .  cholecalciferol (VITAMIN D3) 1,250 mcg (50,000 unit) capsule, TK 1 C PO ONCE WEEKLY PC FOR 8 WKS, Disp: , Rfl:  .  enalapril (VASOTEC) 10 MG tablet, Take 1 tablet (10 mg total) by mouth once daily, Disp: 90 tablet, Rfl: 3 .  ergocalciferol, vitamin D2, 1,250 mcg (50,000 unit) capsule, 1 po weekly for 8 weeks, Disp: , Rfl:  .  FLUoxetine (PROZAC) 20 MG capsule, Take 1 capsule (20 mg total) by mouth once daily, Disp: 30 capsule, Rfl: 11 .  fluticasone propionate (FLONASE) 50 mcg/actuation nasal spray, Place 2 sprays  into both nostrils once daily, Disp: , Rfl:  .  montelukast (SINGULAIR) 10 mg tablet, , Disp: , Rfl:  .  olopatadine (PATADAY) 0.2 % ophthalmic solution, , Disp: , Rfl:  .  rosuvastatin (CRESTOR) 10 MG tablet, Take 1 tablet (10 mg total) by mouth once daily, Disp: 90 tablet, Rfl: 3 .  temazepam (RESTORIL) 15 mg capsule, Take 1 capsule (15 mg total) by mouth nightly as needed for Sleep for up to 30 days, Disp: 30 capsule, Rfl: 0 .  valACYclovir (VALTREX) 500 MG tablet, Take 500 mg by mouth once daily as needed, Disp: , Rfl:   Review of Systems: General:                      No fatigue or weight loss Eyes:                           No vision changes Ears:                            No hearing difficulty Respiratory:  No cough or shortness of breath Pulmonary:                  No asthma or shortness of breath Cardiovascular:           No chest pain, palpitations, dyspnea on exertion Gastrointestinal:          No abdominal bloating, chronic diarrhea, constipations, masses, pain or hematochezia Genitourinary:             No hematuria, dysuria, abnormal vaginal discharge, pelvic pain, Menometrorrhagia Lymphatic:                   No swollen lymph nodes Musculoskeletal:         No muscle weakness Neurologic:                  No extremity weakness, syncope, seizure disorder Psychiatric:                  No history of depression, delusions or suicidal/homicidal ideation    Exam:   There were no vitals filed for this visit.  Body mass index is 34.39 kg/m.  WDWN white/  female in NAD   Lungs: CTA  CV : RRR without murmur   Neck:  no thyromegaly Abdomen: soft , no mass, normal active bowel sounds,  non-tender, no rebound tenderness Pelvic: tanner stage 5 ,  External genitalia: vulva /labia no lesions Urethra: no prolapse Vagina: normal physiologic d/c Cervix: no lesions, no cervical motion tenderness   Uterus: normal size shape and contour, non-tender Adnexa: no mass,   non-tender    Saline infusion sonohysterography: betadine prep to the cervix followed by placement of the HSG catheter into the endometrial canal . Sterile H2O is injected while performing a transvaginal u/s . Findings: Saline [place]  Fibroid seen in endometrial canal= 2.27 x 1.94 x 2.06 cm  Fibroid ut  1 rt post= 31 mm 2 rt ant= 32 mm   Endometrium=7.79 mm  Rt ov and tube out  Lt simple ov cyst= 4.43 cm   Impression:   The primary encounter diagnosis was PMB (postmenopausal bleeding). Diagnoses of Fibroids, submucosal, Thickened endometrium, and Left ovarian cyst were also pertinent to this visit.  Fibroid is the most likely source for her bleeding . Weight may be cause with peripheral conversion of Androstenedione to estrone .   simple left ovarian cyst , asymptomatic . Plan:   Recommend fractional d+c , hysteroscopy and Myosure resection of submucosal fibroid embx performed .  I have explained the need for further evaluation to r/o abnormal lining and the role to remove the fibroid to prevent additional bleeding episodes   Observation of ovarian cyst. Pt is reassured      Caroline Sauger, MD

## 2019-12-15 ENCOUNTER — Inpatient Hospital Stay: Admission: RE | Admit: 2019-12-15 | Payer: Managed Care, Other (non HMO) | Source: Ambulatory Visit

## 2019-12-23 ENCOUNTER — Other Ambulatory Visit: Payer: Managed Care, Other (non HMO)

## 2019-12-31 ENCOUNTER — Telehealth: Payer: Self-pay | Admitting: Obstetrics and Gynecology

## 2020-01-01 NOTE — Telephone Encounter (Signed)
Close note

## 2020-01-13 ENCOUNTER — Other Ambulatory Visit: Payer: Managed Care, Other (non HMO)

## 2020-01-20 ENCOUNTER — Other Ambulatory Visit: Payer: Managed Care, Other (non HMO)

## 2020-01-22 ENCOUNTER — Ambulatory Visit: Admit: 2020-01-22 | Payer: Managed Care, Other (non HMO) | Admitting: Obstetrics and Gynecology

## 2020-01-22 SURGERY — DILATATION & CURETTAGE/HYSTEROSCOPY WITH MYOSURE
Anesthesia: General

## 2020-04-22 ENCOUNTER — Institutional Professional Consult (permissible substitution): Payer: Managed Care, Other (non HMO) | Admitting: Pulmonary Disease

## 2020-05-11 ENCOUNTER — Ambulatory Visit: Payer: Self-pay | Admitting: Internal Medicine

## 2022-06-26 ENCOUNTER — Ambulatory Visit
Admission: EM | Admit: 2022-06-26 | Discharge: 2022-06-26 | Disposition: A | Discharge status: Home / Self Care | Payer: Managed Care, Other (non HMO) | Attending: Emergency Medicine | Admitting: Emergency Medicine

## 2022-06-26 DIAGNOSIS — M62838 Other muscle spasm: Secondary | ICD-10-CM

## 2022-06-26 DIAGNOSIS — M549 Dorsalgia, unspecified: Secondary | ICD-10-CM

## 2022-06-26 MED ORDER — IBUPROFEN 600 MG PO TABS
600.0000 mg | ORAL_TABLET | Freq: Four times a day (QID) | ORAL | 0 refills | Status: DC | PRN
Start: 2022-06-26 — End: 2024-05-18

## 2022-06-26 MED ORDER — METHOCARBAMOL 500 MG PO TABS: 500.0000 mg | ORAL_TABLET | Freq: Two times a day (BID) | ORAL | 0 refills | Status: DC | PRN

## 2022-06-26 NOTE — Discharge Instructions (Addendum)
Take the ibuprofen as needed for discomfort.  Take the muscle relaxer as needed for muscle spasm; Do not drive, operate machinery, or drink alcohol with this medication as it can cause drowsiness.   Follow up with your primary care provider or an orthopedist if your symptoms are not improving.
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
## 2023-10-04 ENCOUNTER — Other Ambulatory Visit: Payer: Self-pay | Admitting: Obstetrics and Gynecology

## 2023-10-04 DIAGNOSIS — Z1231 Encounter for screening mammogram for malignant neoplasm of breast: Secondary | ICD-10-CM

## 2024-05-14 ENCOUNTER — Ambulatory Visit: Payer: Self-pay | Admitting: Family

## 2024-05-18 ENCOUNTER — Ambulatory Visit (INDEPENDENT_AMBULATORY_CARE_PROVIDER_SITE_OTHER): Payer: Self-pay | Admitting: Cardiovascular Disease

## 2024-05-18 ENCOUNTER — Encounter: Payer: Self-pay | Admitting: Cardiovascular Disease

## 2024-05-18 VITALS — BP 124/76 | HR 60 | Ht 61.0 in | Wt 179.0 lb

## 2024-05-18 DIAGNOSIS — R002 Palpitations: Secondary | ICD-10-CM | POA: Diagnosis not present

## 2024-05-18 DIAGNOSIS — R0602 Shortness of breath: Secondary | ICD-10-CM | POA: Diagnosis not present

## 2024-05-18 DIAGNOSIS — I10 Essential (primary) hypertension: Secondary | ICD-10-CM | POA: Diagnosis not present

## 2024-05-18 DIAGNOSIS — E782 Mixed hyperlipidemia: Secondary | ICD-10-CM | POA: Diagnosis not present

## 2024-05-18 DIAGNOSIS — R053 Chronic cough: Secondary | ICD-10-CM

## 2024-05-18 MED ORDER — LEVOFLOXACIN 500 MG PO TABS
500.0000 mg | ORAL_TABLET | Freq: Every day | ORAL | 0 refills | Status: AC
Start: 2024-05-18 — End: ?

## 2024-05-18 NOTE — Progress Notes (Addendum)
 Cardiology Office Note   Date:  05/18/2024   ID:  [name], Selman May 13, 1961, MRN [id number]  PCP:  Orlean Alan HERO, FNP  Cardiologist:  Denyse Bathe, MD      History of Present Illness: Allison Colon is a 63 y.o. female who presents for  Chief Complaint  Patient presents with   Consult    High calcium , SOB    Doing well.      Past Medical History:  Diagnosis Date   Anemia    Anxiety    Arthritis    Elevated antinuclear antibody (ANA) level 2018   Elevated hemoglobin A1c 2018   Family history of heart disease    Fibroid    Hyperlipidemia    Hypertension    Low vitamin D  level 2018   STD (sexually transmitted disease)    HSV     Past Surgical History:  Procedure Laterality Date   broken leg Bilateral    age 46   OVARIAN CYST SURGERY     removed left tube and ovary   PELVIC LAPAROSCOPY  12/2003   RSO     Current Outpatient Medications  Medication Sig Dispense Refill   aspirin EC 81 MG tablet Take 81 mg by mouth at bedtime.      enalapril  (VASOTEC ) 10 MG tablet Take 1 tablet (10 mg total) by mouth daily. 90 tablet 3   levofloxacin  (LEVAQUIN ) 500 MG tablet Take 1 tablet (500 mg total) by mouth daily. 7 tablet 0   melatonin 3 MG TABS tablet Take 3 mg by mouth at bedtime.     rosuvastatin  (CRESTOR ) 10 MG tablet Take 1 tablet (10 mg total) by mouth daily. 90 tablet 3   valACYclovir  (VALTREX ) 500 MG tablet TAKE 1 TABLET BY MOUTH DAILY. INCREASE TO 1 TABLET TWICE DAILY AT OUTBREAK FOR 3 DAYS (Patient taking differently: Take 500 mg by mouth daily.) 30 tablet 2   No current facility-administered medications for this visit.    Allergies:   Patient has no known allergies.    Social History:   reports that she has never smoked. She has never used smokeless tobacco. She reports current alcohol use. She reports that she does not use drugs.   Family History:  family history includes Diabetes in her mother; Gout in her brother; HIV in her  father and sister; Heart disease in her mother and sister; Hepatitis in her brother; Hyperlipidemia in her mother; Hypertension in her father and mother.    ROS:     Review of Systems  Constitutional: Negative.   HENT: Negative.    Eyes: Negative.   Respiratory: Negative.    Gastrointestinal: Negative.   Genitourinary: Negative.   Musculoskeletal: Negative.   Skin: Negative.   Neurological: Negative.   Endo/Heme/Allergies: Negative.   Psychiatric/Behavioral: Negative.    All other systems reviewed and are negative.     All other systems are reviewed and negative.    PHYSICAL EXAM: VS:  BP 124/76   Pulse 60   Ht 5' 1 (1.549 m)   Wt 179 lb (81.2 kg)   SpO2 99%   BMI 33.82 kg/m  , BMI Body mass index is 33.82 kg/m. Last weight:  Wt Readings from Last 3 Encounters:  05/18/24 179 lb (81.2 kg)  06/26/22 192 lb (87.1 kg)  01/15/19 188 lb (85.3 kg)     Physical Exam Constitutional:      Appearance: Normal appearance.  Cardiovascular:     Rate and Rhythm:  Normal rate and regular rhythm.     Heart sounds: Normal heart sounds.  Pulmonary:     Effort: Pulmonary effort is normal.     Breath sounds: Normal breath sounds.  Musculoskeletal:     Right lower leg: No edema.     Left lower leg: No edema.  Neurological:     Mental Status: She is alert.       EKG:   Recent Labs: No results found for requested labs within last 365 days.    Lipid Panel    Component Value Date/Time   CHOL 172 10/23/2018 0833   TRIG 100 10/23/2018 0833   HDL 56 10/23/2018 0833   CHOLHDL 3.1 10/23/2018 0833   CHOLHDL 2.5 06/13/2017 0832   VLDL 16 10/12/2014 0819   LDLCALC 96 10/23/2018 0833   LDLCALC 74 06/13/2017 0832      Other studies Reviewed: Additional studies/ records that were reviewed today include:  Review of the above records demonstrates:       No data to display            ASSESSMENT AND PLAN:    ICD-10-CM   1. Mixed hyperlipidemia  E78.2 levofloxacin   (LEVAQUIN ) 500 MG tablet    PCV ECHOCARDIOGRAM COMPLETE    MYOCARDIAL PERFUSION IMAGING    2. Essential hypertension  I10 levofloxacin  (LEVAQUIN ) 500 MG tablet    PCV ECHOCARDIOGRAM COMPLETE    MYOCARDIAL PERFUSION IMAGING    3. Palpitations  R00.2 levofloxacin  (LEVAQUIN ) 500 MG tablet    PCV ECHOCARDIOGRAM COMPLETE    MYOCARDIAL PERFUSION IMAGING   has palpitation, and some time all day. Do echo    4. SOB (shortness of breath)  R06.02 levofloxacin  (LEVAQUIN ) 500 MG tablet    PCV ECHOCARDIOGRAM COMPLETE    MYOCARDIAL PERFUSION IMAGING   has h/o elevated troponins, and will do stress test.    5. Chronic cough  R05.3 levofloxacin  (LEVAQUIN ) 500 MG tablet    PCV ECHOCARDIOGRAM COMPLETE    MYOCARDIAL PERFUSION IMAGING   dry cough for 1 month, vit d 128 toxic levels.  On enalpril but had cold symptoms first, like runny nose. Has been on it 15 years. try levaquin .       Problem List Items Addressed This Visit       Cardiovascular and Mediastinum   Essential hypertension   Relevant Medications   levofloxacin  (LEVAQUIN ) 500 MG tablet   Other Relevant Orders   PCV ECHOCARDIOGRAM COMPLETE   MYOCARDIAL PERFUSION IMAGING     Other   Hyperlipidemia - Primary   Relevant Medications   levofloxacin  (LEVAQUIN ) 500 MG tablet   Other Relevant Orders   PCV ECHOCARDIOGRAM COMPLETE   MYOCARDIAL PERFUSION IMAGING   Palpitations   Relevant Medications   levofloxacin  (LEVAQUIN ) 500 MG tablet   Other Relevant Orders   PCV ECHOCARDIOGRAM COMPLETE   MYOCARDIAL PERFUSION IMAGING   Other Visit Diagnoses       SOB (shortness of breath)       has h/o elevated troponins, and will do stress test.   Relevant Medications   levofloxacin  (LEVAQUIN ) 500 MG tablet   Other Relevant Orders   PCV ECHOCARDIOGRAM COMPLETE   MYOCARDIAL PERFUSION IMAGING     Chronic cough       dry cough for 1 month, vit d 128 toxic levels.  On enalpril but had cold symptoms first, like runny nose. Has been on it 15  years. try levaquin .   Relevant Medications   levofloxacin  (LEVAQUIN ) 500 MG tablet  Other Relevant Orders   PCV ECHOCARDIOGRAM COMPLETE   MYOCARDIAL PERFUSION IMAGING          Disposition:   Return in about 2 weeks (around 06/01/2024) for echo, stress test and f/u.    Total time spent: 30 minutes  Signed,  Denyse Bathe, MD  05/18/2024 10:46 AM    [facility]

## 2024-05-18 NOTE — Addendum Note (Signed)
 Addended by: FERNAND ALTER A on: 05/18/2024 10:46 AM   Modules accepted: Orders

## 2024-05-19 ENCOUNTER — Encounter: Payer: Self-pay | Admitting: Cardiovascular Disease

## 2024-05-19 ENCOUNTER — Telehealth: Payer: Self-pay

## 2024-05-19 NOTE — Telephone Encounter (Signed)
 Pt called asking if you can change rx levaquin  to something different? She said that the levaquin  has too many side effects and does not want to take, please advise

## 2024-05-21 ENCOUNTER — Other Ambulatory Visit: Payer: Self-pay

## 2024-05-21 MED ORDER — AZITHROMYCIN 250 MG PO TABS: ORAL_TABLET | ORAL | 0 refills | Status: AC

## 2024-05-28 ENCOUNTER — Ambulatory Visit: Payer: Self-pay | Admitting: Family

## 2024-06-02 NOTE — Telephone Encounter (Signed)
 Already taken care of

## 2024-06-04 ENCOUNTER — Ambulatory Visit

## 2024-06-12 ENCOUNTER — Other Ambulatory Visit

## 2024-06-18 ENCOUNTER — Ambulatory Visit: Admitting: Cardiovascular Disease
